# Patient Record
Sex: Male | Born: 1960 | Race: Black or African American | Hispanic: No | Marital: Married | Smoking: Never smoker
Health system: Southern US, Community
[De-identification: ages and names within clinical notes are randomized; demographics above are authoritative.]

## PROBLEM LIST (undated history)

## (undated) DIAGNOSIS — E785 Hyperlipidemia, unspecified: Secondary | ICD-10-CM

## (undated) DIAGNOSIS — E119 Type 2 diabetes mellitus without complications: Secondary | ICD-10-CM

## (undated) DIAGNOSIS — H4010X0 Unspecified open-angle glaucoma, stage unspecified: Secondary | ICD-10-CM

## (undated) DIAGNOSIS — H269 Unspecified cataract: Secondary | ICD-10-CM

## (undated) DIAGNOSIS — I509 Heart failure, unspecified: Secondary | ICD-10-CM

## (undated) DIAGNOSIS — N179 Acute kidney failure, unspecified: Secondary | ICD-10-CM

## (undated) DIAGNOSIS — J189 Pneumonia, unspecified organism: Secondary | ICD-10-CM

## (undated) DIAGNOSIS — M069 Rheumatoid arthritis, unspecified: Secondary | ICD-10-CM

## (undated) DIAGNOSIS — I2510 Atherosclerotic heart disease of native coronary artery without angina pectoris: Secondary | ICD-10-CM

## (undated) DIAGNOSIS — M109 Gout, unspecified: Secondary | ICD-10-CM

## (undated) DIAGNOSIS — I513 Intracardiac thrombosis, not elsewhere classified: Secondary | ICD-10-CM

## (undated) DIAGNOSIS — E559 Vitamin D deficiency, unspecified: Secondary | ICD-10-CM

## (undated) DIAGNOSIS — N289 Disorder of kidney and ureter, unspecified: Secondary | ICD-10-CM

## (undated) DIAGNOSIS — I429 Cardiomyopathy, unspecified: Secondary | ICD-10-CM

## (undated) DIAGNOSIS — I10 Essential (primary) hypertension: Secondary | ICD-10-CM

## (undated) DIAGNOSIS — E213 Hyperparathyroidism, unspecified: Secondary | ICD-10-CM

## (undated) HISTORY — PX: FRACTURE SURGERY: SHX138

---

## 2015-03-31 DIAGNOSIS — E785 Hyperlipidemia, unspecified: Secondary | ICD-10-CM | POA: Insufficient documentation

## 2015-06-27 DIAGNOSIS — M1A09X0 Idiopathic chronic gout, multiple sites, without tophus (tophi): Secondary | ICD-10-CM | POA: Insufficient documentation

## 2015-06-27 DIAGNOSIS — I10 Essential (primary) hypertension: Secondary | ICD-10-CM | POA: Insufficient documentation

## 2015-09-08 DIAGNOSIS — Z7901 Long term (current) use of anticoagulants: Secondary | ICD-10-CM | POA: Insufficient documentation

## 2016-07-10 ENCOUNTER — Inpatient Hospital Stay (HOSPITAL_BASED_OUTPATIENT_CLINIC_OR_DEPARTMENT_OTHER): Payer: Medicare Other

## 2016-07-10 ENCOUNTER — Encounter (HOSPITAL_BASED_OUTPATIENT_CLINIC_OR_DEPARTMENT_OTHER): Payer: Self-pay

## 2016-07-10 ENCOUNTER — Inpatient Hospital Stay (HOSPITAL_BASED_OUTPATIENT_CLINIC_OR_DEPARTMENT_OTHER)
Admission: EM | Admit: 2016-07-10 | Discharge: 2016-07-15 | DRG: 871 | Disposition: A | Discharge status: Home / Self Care | Payer: Medicare Other | Attending: Internal Medicine | Admitting: Internal Medicine

## 2016-07-10 ENCOUNTER — Emergency Department (HOSPITAL_BASED_OUTPATIENT_CLINIC_OR_DEPARTMENT_OTHER): Payer: Medicare Other

## 2016-07-10 DIAGNOSIS — E876 Hypokalemia: Secondary | ICD-10-CM | POA: Diagnosis present

## 2016-07-10 DIAGNOSIS — M069 Rheumatoid arthritis, unspecified: Secondary | ICD-10-CM | POA: Diagnosis present

## 2016-07-10 DIAGNOSIS — R0603 Acute respiratory distress: Secondary | ICD-10-CM

## 2016-07-10 DIAGNOSIS — Z7984 Long term (current) use of oral hypoglycemic drugs: Secondary | ICD-10-CM

## 2016-07-10 DIAGNOSIS — A419 Sepsis, unspecified organism: Secondary | ICD-10-CM | POA: Diagnosis not present

## 2016-07-10 DIAGNOSIS — I130 Hypertensive heart and chronic kidney disease with heart failure and stage 1 through stage 4 chronic kidney disease, or unspecified chronic kidney disease: Secondary | ICD-10-CM | POA: Diagnosis present

## 2016-07-10 DIAGNOSIS — R6521 Severe sepsis with septic shock: Secondary | ICD-10-CM | POA: Diagnosis present

## 2016-07-10 DIAGNOSIS — E872 Acidosis: Secondary | ICD-10-CM | POA: Diagnosis present

## 2016-07-10 DIAGNOSIS — N170 Acute kidney failure with tubular necrosis: Secondary | ICD-10-CM | POA: Diagnosis not present

## 2016-07-10 DIAGNOSIS — Z79899 Other long term (current) drug therapy: Secondary | ICD-10-CM

## 2016-07-10 DIAGNOSIS — Z7901 Long term (current) use of anticoagulants: Secondary | ICD-10-CM | POA: Diagnosis not present

## 2016-07-10 DIAGNOSIS — I428 Other cardiomyopathies: Secondary | ICD-10-CM | POA: Diagnosis present

## 2016-07-10 DIAGNOSIS — N183 Chronic kidney disease, stage 3 unspecified: Secondary | ICD-10-CM | POA: Diagnosis present

## 2016-07-10 DIAGNOSIS — N184 Chronic kidney disease, stage 4 (severe): Secondary | ICD-10-CM | POA: Diagnosis present

## 2016-07-10 DIAGNOSIS — N179 Acute kidney failure, unspecified: Secondary | ICD-10-CM | POA: Diagnosis present

## 2016-07-10 DIAGNOSIS — J9601 Acute respiratory failure with hypoxia: Secondary | ICD-10-CM | POA: Diagnosis present

## 2016-07-10 DIAGNOSIS — J181 Lobar pneumonia, unspecified organism: Secondary | ICD-10-CM

## 2016-07-10 DIAGNOSIS — E878 Other disorders of electrolyte and fluid balance, not elsewhere classified: Secondary | ICD-10-CM | POA: Diagnosis present

## 2016-07-10 DIAGNOSIS — R0600 Dyspnea, unspecified: Secondary | ICD-10-CM | POA: Diagnosis not present

## 2016-07-10 DIAGNOSIS — Z6841 Body Mass Index (BMI) 40.0 and over, adult: Secondary | ICD-10-CM

## 2016-07-10 DIAGNOSIS — D638 Anemia in other chronic diseases classified elsewhere: Secondary | ICD-10-CM | POA: Diagnosis present

## 2016-07-10 DIAGNOSIS — H4010X0 Unspecified open-angle glaucoma, stage unspecified: Secondary | ICD-10-CM | POA: Diagnosis present

## 2016-07-10 DIAGNOSIS — E1165 Type 2 diabetes mellitus with hyperglycemia: Secondary | ICD-10-CM | POA: Diagnosis present

## 2016-07-10 DIAGNOSIS — I5043 Acute on chronic combined systolic (congestive) and diastolic (congestive) heart failure: Secondary | ICD-10-CM | POA: Diagnosis present

## 2016-07-10 DIAGNOSIS — J9621 Acute and chronic respiratory failure with hypoxia: Secondary | ICD-10-CM

## 2016-07-10 DIAGNOSIS — E785 Hyperlipidemia, unspecified: Secondary | ICD-10-CM | POA: Diagnosis present

## 2016-07-10 DIAGNOSIS — Z8249 Family history of ischemic heart disease and other diseases of the circulatory system: Secondary | ICD-10-CM | POA: Diagnosis not present

## 2016-07-10 DIAGNOSIS — I513 Intracardiac thrombosis, not elsewhere classified: Secondary | ICD-10-CM | POA: Diagnosis present

## 2016-07-10 DIAGNOSIS — J159 Unspecified bacterial pneumonia: Secondary | ICD-10-CM | POA: Diagnosis present

## 2016-07-10 DIAGNOSIS — E1122 Type 2 diabetes mellitus with diabetic chronic kidney disease: Secondary | ICD-10-CM | POA: Diagnosis present

## 2016-07-10 DIAGNOSIS — J189 Pneumonia, unspecified organism: Secondary | ICD-10-CM | POA: Diagnosis present

## 2016-07-10 HISTORY — DX: Atherosclerotic heart disease of native coronary artery without angina pectoris: I25.10

## 2016-07-10 HISTORY — DX: Hyperparathyroidism, unspecified: E21.3

## 2016-07-10 HISTORY — DX: Rheumatoid arthritis, unspecified: M06.9

## 2016-07-10 HISTORY — DX: Hyperlipidemia, unspecified: E78.5

## 2016-07-10 HISTORY — DX: Intracardiac thrombosis, not elsewhere classified: I51.3

## 2016-07-10 HISTORY — DX: Acute kidney failure, unspecified: N17.9

## 2016-07-10 HISTORY — DX: Gout, unspecified: M10.9

## 2016-07-10 HISTORY — DX: Pneumonia, unspecified organism: J18.9

## 2016-07-10 HISTORY — DX: Unspecified cataract: H26.9

## 2016-07-10 HISTORY — DX: Heart failure, unspecified: I50.9

## 2016-07-10 HISTORY — DX: Unspecified open-angle glaucoma, stage unspecified: H40.10X0

## 2016-07-10 HISTORY — DX: Cardiomyopathy, unspecified: I42.9

## 2016-07-10 HISTORY — DX: Essential (primary) hypertension: I10

## 2016-07-10 HISTORY — DX: Vitamin D deficiency, unspecified: E55.9

## 2016-07-10 HISTORY — DX: Disorder of kidney and ureter, unspecified: N28.9

## 2016-07-10 HISTORY — DX: Type 2 diabetes mellitus without complications: E11.9

## 2016-07-10 LAB — COMPREHENSIVE METABOLIC PANEL
ALBUMIN: 2.9 g/dL — AB (ref 3.5–5.0)
ALK PHOS: 58 U/L (ref 38–126)
ALT: 22 U/L (ref 17–63)
AST: 21 U/L (ref 15–41)
Anion gap: 12 (ref 5–15)
BILIRUBIN TOTAL: 1 mg/dL (ref 0.3–1.2)
BUN: 41 mg/dL — ABNORMAL HIGH (ref 6–20)
CO2: 18 mmol/L — ABNORMAL LOW (ref 22–32)
Calcium: 7.5 mg/dL — ABNORMAL LOW (ref 8.9–10.3)
Chloride: 109 mmol/L (ref 101–111)
Creatinine, Ser: 4.1 mg/dL — ABNORMAL HIGH (ref 0.61–1.24)
GFR calc Af Amer: 17 mL/min — ABNORMAL LOW (ref >60)
GFR calc non Af Amer: 15 mL/min — ABNORMAL LOW (ref >60)
GLUCOSE: 183 mg/dL — AB (ref 65–99)
Potassium: 3.2 mmol/L — ABNORMAL LOW (ref 3.5–5.1)
Sodium: 139 mmol/L (ref 135–145)
Total Protein: 6.5 g/dL (ref 6.5–8.1)

## 2016-07-10 LAB — CBC WITH DIFFERENTIAL/PLATELET
BASOS ABS: 0 10*3/uL (ref 0.0–0.1)
Basophils Relative: 0 %
Eosinophils Absolute: 0 10*3/uL (ref 0.0–0.7)
Eosinophils Relative: 0 %
HCT: 37.4 % — ABNORMAL LOW (ref 39.0–52.0)
HEMOGLOBIN: 12.6 g/dL — AB (ref 13.0–17.0)
LYMPHS ABS: 1.5 10*3/uL (ref 0.7–4.0)
Lymphocytes Relative: 7 %
MCH: 25 pg — AB (ref 26.0–34.0)
MCHC: 33.7 g/dL (ref 30.0–36.0)
MCV: 74.2 fL — AB (ref 78.0–100.0)
MONO ABS: 2.3 10*3/uL — AB (ref 0.1–1.0)
MONOS PCT: 11 %
NEUTROS ABS: 17.1 10*3/uL — AB (ref 1.7–7.7)
Neutrophils Relative %: 82 %
PLATELETS: 236 10*3/uL (ref 150–400)
RBC: 5.04 MIL/uL (ref 4.22–5.81)
RDW: 16.6 % — ABNORMAL HIGH (ref 11.5–15.5)
WBC Morphology: INCREASED
WBC: 20.9 10*3/uL — AB (ref 4.0–10.5)

## 2016-07-10 LAB — I-STAT ARTERIAL BLOOD GAS, ED
Acid-base deficit: 6 mmol/L — ABNORMAL HIGH (ref 0.0–2.0)
Acid-base deficit: 8 mmol/L — ABNORMAL HIGH (ref 0.0–2.0)
BICARBONATE: 18.7 mmol/L — AB (ref 20.0–28.0)
BICARBONATE: 15.8 mmol/L — AB (ref 20.0–28.0)
O2 SAT: 93 %
O2 Saturation: 100 %
PO2 ART: 197 mmHg — AB (ref 83.0–108.0)
Patient temperature: 98.3
TCO2: 20 mmol/L (ref 0–100)
TCO2: 17 mmol/L (ref 0–100)
pCO2 arterial: 32.1 mmHg (ref 32.0–48.0)
pCO2 arterial: 29.1 mmHg — ABNORMAL LOW (ref 32.0–48.0)
pH, Arterial: 7.372 (ref 7.350–7.450)
pH, Arterial: 7.346 — ABNORMAL LOW (ref 7.350–7.450)
pO2, Arterial: 72 mmHg — ABNORMAL LOW (ref 83.0–108.0)

## 2016-07-10 LAB — RESPIRATORY PANEL BY PCR
ADENOVIRUS-RVPPCR: NOT DETECTED
Bordetella pertussis: NOT DETECTED
CHLAMYDOPHILA PNEUMONIAE-RVPPCR: NOT DETECTED
CORONAVIRUS NL63-RVPPCR: NOT DETECTED
Coronavirus 229E: NOT DETECTED
Coronavirus HKU1: NOT DETECTED
Coronavirus OC43: NOT DETECTED
INFLUENZA A-RVPPCR: NOT DETECTED
Influenza B: NOT DETECTED
MYCOPLASMA PNEUMONIAE-RVPPCR: NOT DETECTED
Metapneumovirus: NOT DETECTED
PARAINFLUENZA VIRUS 4-RVPPCR: NOT DETECTED
Parainfluenza Virus 1: NOT DETECTED
Parainfluenza Virus 2: NOT DETECTED
Parainfluenza Virus 3: NOT DETECTED
Respiratory Syncytial Virus: NOT DETECTED
Rhinovirus / Enterovirus: NOT DETECTED

## 2016-07-10 LAB — I-STAT CG4 LACTIC ACID, ED
Lactic Acid, Venous: 1.55 mmol/L (ref 0.5–1.9)
Lactic Acid, Venous: 1.26 mmol/L (ref 0.5–1.9)

## 2016-07-10 LAB — BASIC METABOLIC PANEL
Anion gap: 9 (ref 5–15)
BUN: 37 mg/dL — AB (ref 6–20)
CHLORIDE: 114 mmol/L — AB (ref 101–111)
CO2: 19 mmol/L — ABNORMAL LOW (ref 22–32)
CREATININE: 3.48 mg/dL — AB (ref 0.61–1.24)
Calcium: 8.2 mg/dL — ABNORMAL LOW (ref 8.9–10.3)
GFR calc Af Amer: 21 mL/min — ABNORMAL LOW (ref >60)
GFR calc non Af Amer: 18 mL/min — ABNORMAL LOW (ref >60)
GLUCOSE: 142 mg/dL — AB (ref 65–99)
POTASSIUM: 2.8 mmol/L — AB (ref 3.5–5.1)
SODIUM: 142 mmol/L (ref 135–145)

## 2016-07-10 LAB — LACTIC ACID, PLASMA
LACTIC ACID, VENOUS: 2.6 mmol/L — AB (ref 0.5–1.9)
Lactic Acid, Venous: 2.3 mmol/L (ref 0.5–1.9)

## 2016-07-10 LAB — TROPONIN I
TROPONIN I: 0.03 ng/mL — AB (ref <0.03)
Troponin I: 0.03 ng/mL (ref <0.03)

## 2016-07-10 LAB — PROTIME-INR
INR: 2.66
Prothrombin Time: 28.9 seconds — ABNORMAL HIGH (ref 11.4–15.2)

## 2016-07-10 LAB — MRSA PCR SCREENING: MRSA BY PCR: NEGATIVE

## 2016-07-10 LAB — GLUCOSE, CAPILLARY
GLUCOSE-CAPILLARY: 188 mg/dL — AB (ref 65–99)
Glucose-Capillary: 205 mg/dL — ABNORMAL HIGH (ref 65–99)
Glucose-Capillary: 213 mg/dL — ABNORMAL HIGH (ref 65–99)

## 2016-07-10 LAB — C DIFFICILE QUICK SCREEN W PCR REFLEX
C DIFFICILE (CDIFF) INTERP: NOT DETECTED
C DIFFICILE (CDIFF) TOXIN: NEGATIVE
C Diff antigen: NEGATIVE

## 2016-07-10 LAB — MAGNESIUM: MAGNESIUM: 1 mg/dL — AB (ref 1.7–2.4)

## 2016-07-10 LAB — BRAIN NATRIURETIC PEPTIDE: B Natriuretic Peptide: 123.3 pg/mL — ABNORMAL HIGH (ref 0.0–100.0)

## 2016-07-10 LAB — PROCALCITONIN

## 2016-07-10 LAB — DIGOXIN LEVEL: Digoxin Level: <0.2 ng/mL — ABNORMAL LOW (ref 0.8–2.0)

## 2016-07-10 MED ORDER — DEXTROSE 5 % IV SOLN
500.0000 mg | INTRAVENOUS | Status: DC
  Administered 2016-07-10 – 2016-07-15 (×6): 500 mg via INTRAVENOUS
  Filled 2016-07-10 (×5): qty 500

## 2016-07-10 MED ORDER — LEVALBUTEROL HCL 0.63 MG/3ML IN NEBU
0.6300 mg | INHALATION_SOLUTION | Freq: Once | RESPIRATORY_TRACT | Status: AC
  Administered 2016-07-10: 0.63 mg via RESPIRATORY_TRACT
  Filled 2016-07-10: qty 3

## 2016-07-10 MED ORDER — CEFTRIAXONE SODIUM 1 G IJ SOLR: 1.0000 g | Freq: Once | INTRAMUSCULAR | Status: DC

## 2016-07-10 MED ORDER — POTASSIUM CHLORIDE CRYS ER 20 MEQ PO TBCR
40.0000 meq | EXTENDED_RELEASE_TABLET | Freq: Two times a day (BID) | ORAL | Status: AC
  Administered 2016-07-10 – 2016-07-11 (×2): 40 meq via ORAL
  Filled 2016-07-10 (×2): qty 2

## 2016-07-10 MED ORDER — CARVEDILOL 25 MG PO TABS
25.0000 mg | ORAL_TABLET | Freq: Two times a day (BID) | ORAL | Status: DC
  Administered 2016-07-10 – 2016-07-15 (×9): 25 mg via ORAL
  Filled 2016-07-10 (×5): qty 1
  Filled 2016-07-10 (×2): qty 2
  Filled 2016-07-10: qty 1
  Filled 2016-07-10: qty 2

## 2016-07-10 MED ORDER — ACETAMINOPHEN 325 MG PO TABS
650.0000 mg | ORAL_TABLET | Freq: Once | ORAL | Status: AC
  Administered 2016-07-10: 650 mg via ORAL
  Filled 2016-07-10: qty 2

## 2016-07-10 MED ORDER — SODIUM CHLORIDE 0.9 % IV BOLUS (SEPSIS): 1000.0000 mL | Freq: Once | INTRAVENOUS | Status: DC

## 2016-07-10 MED ORDER — LABETALOL HCL 5 MG/ML IV SOLN
10.0000 mg | INTRAVENOUS | Status: DC | PRN
  Administered 2016-07-10 – 2016-07-14 (×4): 10 mg via INTRAVENOUS
  Filled 2016-07-10 (×5): qty 4

## 2016-07-10 MED ORDER — PIPERACILLIN-TAZOBACTAM IN DEX 2-0.25 GM/50ML IV SOLN
2.2500 g | Freq: Once | INTRAVENOUS | Status: DC
Start: 2016-07-10 — End: 2016-07-10
  Filled 2016-07-10: qty 50

## 2016-07-10 MED ORDER — POTASSIUM CHLORIDE CRYS ER 20 MEQ PO TBCR
40.0000 meq | EXTENDED_RELEASE_TABLET | Freq: Two times a day (BID) | ORAL | Status: DC
  Administered 2016-07-10: 40 meq via ORAL
  Filled 2016-07-10: qty 2

## 2016-07-10 MED ORDER — AZITHROMYCIN 500 MG IV SOLR
INTRAVENOUS | Status: AC
  Filled 2016-07-10: qty 500

## 2016-07-10 MED ORDER — INSULIN ASPART 100 UNIT/ML ~~LOC~~ SOLN
0.0000 [IU] | SUBCUTANEOUS | Status: DC
  Administered 2016-07-10 (×2): 5 [IU] via SUBCUTANEOUS
  Administered 2016-07-10: 3 [IU] via SUBCUTANEOUS
  Administered 2016-07-11: 2 [IU] via SUBCUTANEOUS

## 2016-07-10 MED ORDER — SODIUM CHLORIDE 0.9 % IV BOLUS (SEPSIS)
1000.0000 mL | Freq: Once | INTRAVENOUS | Status: AC
  Administered 2016-07-10: 1000 mL via INTRAVENOUS

## 2016-07-10 MED ORDER — LACTATED RINGERS IV SOLN
INTRAVENOUS | Status: DC
  Administered 2016-07-10 – 2016-07-12 (×4): via INTRAVENOUS

## 2016-07-10 MED ORDER — WARFARIN SODIUM 2.5 MG PO TABS
7.5000 mg | ORAL_TABLET | Freq: Once | ORAL | Status: AC
  Administered 2016-07-10: 7.5 mg via ORAL
  Filled 2016-07-10: qty 1

## 2016-07-10 MED ORDER — WARFARIN - PHARMACIST DOSING INPATIENT: Freq: Every day | Status: DC

## 2016-07-10 MED ORDER — DEXTROSE 5 % IV SOLN
1.0000 g | INTRAVENOUS | Status: DC
  Administered 2016-07-10 – 2016-07-15 (×6): 1 g via INTRAVENOUS
  Filled 2016-07-10 (×6): qty 10

## 2016-07-10 MED ORDER — POTASSIUM CHLORIDE 10 MEQ/100ML IV SOLN: 10.0000 meq | Freq: Once | INTRAVENOUS | Status: DC

## 2016-07-10 MED ORDER — ACETAMINOPHEN 325 MG PO TABS
650.0000 mg | ORAL_TABLET | ORAL | Status: DC | PRN
  Administered 2016-07-10: 650 mg via ORAL
  Filled 2016-07-10: qty 2

## 2016-07-10 MED ORDER — LACTATED RINGERS IV BOLUS (SEPSIS)
3000.0000 mL | Freq: Once | INTRAVENOUS | Status: AC
  Administered 2016-07-10: 3000 mL via INTRAVENOUS

## 2016-07-10 MED ORDER — PANTOPRAZOLE SODIUM 40 MG IV SOLR
40.0000 mg | Freq: Every day | INTRAVENOUS | Status: DC
  Administered 2016-07-10: 40 mg via INTRAVENOUS
  Filled 2016-07-10: qty 40

## 2016-07-10 MED ORDER — AMLODIPINE BESYLATE 10 MG PO TABS
10.0000 mg | ORAL_TABLET | Freq: Every day | ORAL | Status: DC
  Administered 2016-07-10 – 2016-07-15 (×6): 10 mg via ORAL
  Filled 2016-07-10 (×6): qty 1

## 2016-07-10 MED ORDER — SODIUM CHLORIDE 0.9 % IV SOLN: 250.0000 mL | INTRAVENOUS | Status: DC | PRN

## 2016-07-10 MED ORDER — CHLORHEXIDINE GLUCONATE 0.12 % MT SOLN
15.0000 mL | Freq: Two times a day (BID) | OROMUCOSAL | Status: DC
  Administered 2016-07-10 – 2016-07-11 (×3): 15 mL via OROMUCOSAL
  Filled 2016-07-10 (×3): qty 15

## 2016-07-10 MED ORDER — DEXTROSE 5 % IV SOLN: 500.0000 mg | Freq: Once | INTRAVENOUS | Status: DC

## 2016-07-10 MED ORDER — ORAL CARE MOUTH RINSE
15.0000 mL | Freq: Two times a day (BID) | OROMUCOSAL | Status: DC
  Administered 2016-07-10: 15 mL via OROMUCOSAL

## 2016-07-10 NOTE — Progress Notes (Signed)
CRITICAL VALUE ALERT  Critical value received:  Lactic - 2.3, troponin - 0.03  Date of notification:  07/10/16  Time of notification:  13:20  Critical value read back:Yes.    Nurse who received alert:  Juel Burrow, RN  MD notified (1st page):  Marni Griffon, ACNP  Time of first page:  13:20  MD notified (2nd page):  Time of second page:  Responding MD:  Marni Griffon, ACNP  Time MD responded: 13:20

## 2016-07-10 NOTE — ED Notes (Signed)
IV attempt x3 with no success, EDP notified and aware; only 1 BC collected

## 2016-07-10 NOTE — Progress Notes (Signed)
Pt requesting break from BIPAP.  Pt placed on 10 LPM [place].  Pt tolerating well at this time, RT to monitor and assess as needed.

## 2016-07-10 NOTE — Progress Notes (Signed)
[facility] Pt received on BIPAP. Rt tried to place pt on 10 LPMHF sat 94%. Pt complain on SOB. Per NP ok to place back on  BIPAP.

## 2016-07-10 NOTE — ED Provider Notes (Signed)
[facility] Provider Note: Georgena Spurling, MD, FACEP  CSN: [id number] MRN: [id number] ARRIVAL: 07/10/16 at [place]: [place]  Joshua Fernandez is a 56 y.o. male with congestive heart failure, diabetes and stage III renal insufficiency. He is here with a two-day history of shortness of breath. This began with cough and cold symptoms yesterday which have gradually worsened. He has had paroxysms of cough that have caused him to gag. His breathing became acutely worse this morning and he was in acute distress on arrival with hypoxia. He has had a subjective fever but no chest pain. He has not been vomiting or had diarrhea. He has had general malaise.   Past Medical History:  Diagnosis Date  . AKI (acute kidney injury) ([place])   . Cardiomyopathy ([place])   . Cataract   . CHF (congestive heart failure) ([place])   . Coronary artery disease   . Diabetes mellitus without complication ([place])   . Gout   . Hyperlipemia   . Hyperparathyroidism ([place])   . Hypertension   . Open-angle glaucoma   . Pneumonia   . Renal disorder    chronic kidney disease stage 3  . Rheumatoid arthritis ([place])   . 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 Vitamin D deficiency     Past Surgical History:  Procedure Laterality Date  . FRACTURE SURGERY      No family history on file.  Social History  Substance Use Topics  . Smoking status: Never Smoker  . Smokeless tobacco: Never Used  . Alcohol use No    Prior to Admission medications   Medication Sig Start Date End Date Taking? Authorizing Provider  amLODipine (NORVASC) 10 MG tablet Take 10 mg by mouth daily.   Yes Historical Provider, MD  atorvastatin (LIPITOR) 40 MG tablet Take 40 mg by mouth daily.   Yes Historical Provider, MD  carvedilol (COREG) 25 MG tablet Take 25 mg by mouth 2 (two) times daily with a meal.   Yes Historical Provider, MD    cholecalciferol (VITAMIN D) 400 units TABS tablet Take 4,000 Units by mouth.   Yes Historical Provider, MD  digoxin (LANOXIN) 0.25 MG tablet Take 125 mg by mouth daily.   Yes Historical Provider, MD  glipiZIDE (GLUCOTROL XL) 10 MG 24 hr tablet Take 10 mg by mouth daily with breakfast.   Yes Historical Provider, MD  latanoprost (XALATAN) 0.005 % ophthalmic solution Place 1 drop into both eyes at bedtime.   Yes Historical Provider, MD  lisinopril (PRINIVIL,ZESTRIL) 40 MG tablet Take 40 mg by mouth daily.   Yes Historical Provider, MD  potassium chloride SA (K-DUR,KLOR-CON) 20 MEQ tablet Take 20 mEq by mouth 2 (two) times daily.   Yes Historical Provider, MD  warfarin (COUMADIN) 10 MG tablet Take 10 mg by mouth daily.   Yes Historical Provider, MD    Allergies Patient has no known allergies.   REVIEW OF SYSTEMS  Negative except as noted here or in the History of Present Illness.   PHYSICAL EXAMINATION  Initial Vital Signs Blood pressure (!) 108/53, pulse (!) 104, temperature 98.3 F (36.8 C), temperature source Oral, resp. rate (!) 37, SpO2 99 %.  Examination General: Well-developed, well-nourished male in moderate distress; appearance consistent with age of record HENT: normocephalic; atraumatic Eyes: pupils equal, round and reactive to light; extraocular muscles intact Neck: supple Heart: regular rate and rhythm; tachycardia  Lungs: Tachypnea; rales right base Abdomen: soft; nondistended; nontender; bowel sounds present Extremities: No deformity; full range of motion; pulses normal Neurologic: Awake, alert; motor function intact in all extremities and symmetric; no facial droop Skin: Warm and dry Psychiatric: Anxious   RESULTS  Summary of this visit's results, reviewed by myself:   EKG Interpretation  Date/Time:  Wednesday July 10 2016 03:30:16 EDT Ventricular Rate:  106 PR Interval:    QRS Duration: 102 QT Interval:  327 QTC Calculation: 435 R Axis:   91 Text  Interpretation:  Sinus tachycardia Prolonged PR interval Left posterior fascicular block Nonspecific repol abnormality, lateral leads Baseline wander in lead(s) III No previous ECGs available Confirmed by Shawnmichael Parenteau  MD, Jenny Reichmann ([id number]) on 07/10/2016 4:00:26 AM      Laboratory Studies: Results for orders placed or performed during the hospital encounter of 07/10/16 (from the past 24 hour(s))  Brain natriuretic peptide     Status: Abnormal   Collection Time: 07/10/16  3:30 AM  Result Value Ref Range   B Natriuretic Peptide 123.3 (H) 0.0 - 100.0 pg/mL  Troponin I     Status: None   Collection Time: 07/10/16  3:30 AM  Result Value Ref Range   Troponin I <0.03 <0.03 ng/mL  CBC with Differential/Platelet     Status: Abnormal   Collection Time: 07/10/16  3:30 AM  Result Value Ref Range   WBC 20.9 (H) 4.0 - 10.5 K/uL   RBC 5.04 4.22 - 5.81 MIL/uL   Hemoglobin 12.6 (L) 13.0 - 17.0 g/dL   HCT 37.4 (L) 39.0 - 52.0 %   MCV 74.2 (L) 78.0 - 100.0 fL   MCH 25.0 (L) 26.0 - 34.0 pg   MCHC 33.7 30.0 - 36.0 g/dL   RDW 16.6 (H) 11.5 - 15.5 %   Platelets 236 150 - 400 K/uL   Neutrophils Relative % 82 %   Lymphocytes Relative 7 %   Monocytes Relative 11 %   Eosinophils Relative 0 %   Basophils Relative 0 %   Neutro Abs 17.1 (H) 1.7 - 7.7 K/uL   Lymphs Abs 1.5 0.7 - 4.0 K/uL   Monocytes Absolute 2.3 (H) 0.1 - 1.0 K/uL   Eosinophils Absolute 0.0 0.0 - 0.7 K/uL   Basophils Absolute 0.0 0.0 - 0.1 K/uL   RBC Morphology ELLIPTOCYTES    WBC Morphology INCREASED BANDS (>20% BANDS)    Smear Review PLATELET COUNT CONFIRMED BY SMEAR   Basic metabolic panel     Status: Abnormal   Collection Time: 07/10/16  3:30 AM  Result Value Ref Range   Sodium 142 135 - 145 mmol/L   Potassium 2.8 (L) 3.5 - 5.1 mmol/L   Chloride 114 (H) 101 - 111 mmol/L   CO2 19 (L) 22 - 32 mmol/L   Glucose, Bld 142 (H) 65 - 99 mg/dL   BUN 37 (H) 6 - 20 mg/dL   Creatinine, Ser 3.48 (H) 0.61 - 1.24 mg/dL   Calcium 8.2 (L) 8.9 - 10.3 mg/dL     GFR calc non Af Amer 18 (L) >60 mL/min   GFR calc Af Amer 21 (L) >60 mL/min   Anion gap 9 5 - 15  Protime-INR     Status: Abnormal   Collection Time: 07/10/16  3:30 AM  Result Value Ref Range   Prothrombin Time 28.9 (H) 11.4 - 15.2 seconds   INR 2.66   Magnesium     Status: Abnormal   Collection Time: 07/10/16  3:30 AM  Result  Value Ref Range   Magnesium 1.0 (L) 1.7 - 2.4 mg/dL  I-Stat Arterial Blood Gas, ED - (order at [facility] and [facility] only)     Status: Abnormal   Collection Time: 07/10/16  4:12 AM  Result Value Ref Range   pH, Arterial 7.372 7.350 - 7.450   pCO2 arterial 32.1 32.0 - 48.0 mmHg   pO2, Arterial 197.0 (H) 83.0 - 108.0 mmHg   Bicarbonate 18.7 (L) 20.0 - 28.0 mmol/L   TCO2 20 0 - 100 mmol/L   O2 Saturation 100.0 %   Acid-base deficit 6.0 (H) 0.0 - 2.0 mmol/L   Patient temperature 98.3 F    Collection site RADIAL, ALLEN'S TEST ACCEPTABLE    Drawn by RT    Sample type ARTERIAL   I-Stat CG4 Lactic Acid, ED     Status: None   Collection Time: 07/10/16  5:03 AM  Result Value Ref Range   Lactic Acid, Venous 1.55 0.5 - 1.9 mmol/L   Imaging Studies: Dg Chest Port 1 View  Result Date: 07/10/2016 CLINICAL DATA:  Acute onset of severe shortness of breath. Initial encounter. EXAM: PORTABLE CHEST 1 VIEW COMPARISON:  None. FINDINGS: The lungs are hypoexpanded. Vascular congestion is noted. Right basilar airspace opacity raises concern for pneumonia. There is no evidence of pleural effusion or pneumothorax. The cardiomediastinal silhouette is enlarged. No acute osseous abnormalities are seen. IMPRESSION: 1. Lungs hypoexpanded. Right basilar airspace opacity raises concern for pneumonia. 2. Vascular congestion and cardiomegaly. Electronically Signed   By: Garald Balding M.D.   On: 07/10/2016 04:02    ED COURSE  Nursing notes and initial vitals signs, including pulse oximetry, reviewed.  Vitals:   07/10/16 0351 07/10/16 0415 07/10/16 0446 07/10/16 0555  BP:    (!) 126/95  Pulse:  (!) 104 97  89  Resp: (!) 37 18  (!) 36  Temp:    (!) 101.6 F (38.7 C)  TempSrc:    Rectal  SpO2: 99% 97% 97% 97%   5:01 AM Patient placed on BiPAP immediately after arrival. Once diagnostic studies were obtained became apparent that his symptoms were likely due to community-acquired pneumonia as opposed to acute exacerbation of congestive heart failure.  Lactic acid is within normal limits. Blood cultures drawn.  5:16 AM Dr. Loleta Books except for transfer to [facility] stepdown. We will start Rocephin and Zithromax for treatment of his community acquired pneumonia.  5:50 AM Temperature 101.6 degrees rectally. Tylenol ordered.  6:50 AM Stable; sleeping peacefully on BiPAP.  PROCEDURES   CRITICAL CARE Performed by: Shanon Rosser L Total critical care time: 45 minutes Critical care time was exclusive of separately billable procedures and treating other patients. Critical care was necessary to treat or prevent imminent or life-threatening deterioration. Critical care was time spent personally by me on the following activities: development of treatment plan with patient and/or surrogate as well as nursing, discussions with consultants, evaluation of patient's response to treatment, examination of patient, obtaining history from patient or surrogate, ordering and performing treatments and interventions, ordering and review of laboratory studies, ordering and review of radiographic studies, pulse oximetry and re-evaluation of patient's condition.   ED DIAGNOSES     ICD-9-CM ICD-10-CM   1. Community acquired pneumonia of right lower lobe of lung ([place]) 481 J18.1   2. Respiratory distress 786.09 R06.00   3. Chronic renal disease, stage III 585.3 N18.3   4. Hypokalemia 276.8 E87.6        Shanon Rosser, MD 07/10/16 [phone]

## 2016-07-10 NOTE — ED Provider Notes (Addendum)
Patient the proximally and 1 hour ago dropped his blood pressures were getting pressures around 80 systolic but mostly upper 62O systolic. Patient received 700 mL of fluid sepsis formally initiated. Patient RE received antibiotics. After 7 mL of fluid blood pressure came up to the 469 systolic. Patient also had repeat chest x-ray without any significant change other than the known to me acquired pneumonia. Arterial blood gas without significant change scars a respiratory standpoint. Slightly worsening of pH and metabolic acidosis status. No respiratory worsening. Patient still alert and oriented on the BiPAP.  Patient was originally arranged for [facility]down admission [facility] about 4-1/2 hours ago. Discussed with step down bed that was available at [facility] long the hospitalist was not comfortable. Called critical care. Critical care willing to put him in the ICU overnight until he is better stabilized. They agree with treatment plan so far. Patient's had to lactic acid set of been less than 2.  Patient has a known history of renal insufficiency not on dialysis. Chest x-ray showed no evidence of congestive heart failure pulmonary edema. Patient was also does have a history of CHF. Patient's blood pressure improvement also could be because his blood pressure medicines and not been administered today. That may be helping out. Patient was to receive 4 L of fluid based on sepsis protocol but since his blood pressures have improved it was stopped at a total of 700 mL.  Pulmonary critical care has accepted into the ICU at [place]. [facility] will transport.   CRITICAL CARE Performed by: Fredia Sorrow Total critical care time: 30 minutes Critical care time was exclusive of separately billable procedures and treating other patients. Critical care was necessary to treat or prevent imminent or life-threatening deterioration. Critical care was time spent personally by me on the following activities: development of  treatment plan with patient and/or surrogate as well as nursing, discussions with consultants, evaluation of patient's response to treatment, examination of patient, obtaining history from patient or surrogate, ordering and performing treatments and interventions, ordering and review of laboratory studies, ordering and review of radiographic studies, pulse oximetry and re-evaluation of patient's condition.    Fredia Sorrow, MD 07/10/16 [phone]    No ICU beds at [facility] patient [facility] long admitting critical care physician unchanged. It will be Dr. Samella Parr, MD 07/10/16 1005

## 2016-07-10 NOTE — Progress Notes (Signed)
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  Initially he had 2 days dyspnea, htought he was having CHF flare.  In ER, was in distress, put on BiPAP.   Blood pressure (!) 108/53, pulse 97, temperature 98.3 F (36.8 C), temperature source Oral, resp. rate 18, SpO2 97 %. Na 142, K 2.8, Cr 3.48 (baseline 2.7-3.1), WBC 20.9, Hgb 12 Lactic acid 1.5 BNP 120 Troponin negative CXR showed possibly right basilar opacity ABG showed 7.37, pCO2 32, pO2 190  Blood cultures obtained, ceftriaxone and azithromycin given To stepdown on BiPAP.

## 2016-07-10 NOTE — ED Notes (Signed)
Pt wife Theophilus Kinds [phone]

## 2016-07-10 NOTE — ED Notes (Signed)
ED Provider at bedside. 

## 2016-07-10 NOTE — Progress Notes (Signed)
[place] Progress Note Patient Name: Lonald Troiani DOB: May 31, 1960 MRN: [id number]   Date of Service  07/10/2016  HPI/Events of Note  htn  IV labe  eICU Interventions       Intervention Category Intermediate Interventions: Hypertension - evaluation and management  Raylene Miyamoto. 07/10/2016, 10:48 PM

## 2016-07-10 NOTE — H&P (Signed)
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  Presents to ER at [facility] w/ 2d h/o what was initially described as URI symptoms, subjective fever, general malaise, head congestion, sore throat & cough. Symptoms worsened, coughing worse. On arrival to ER was temp 101.6, noted to be hypoxic w/ increased work of breathing which ultimately required NIPPV. CXR showed RLL infiltrate. Was to be admitted to SDU. While awaiting bed SBP dropped to 80s. Received 700 ml bolus and SBP improved to 136. Because of this 51ml/kg bolus was held off. He did receive abx in the ER. Because of his hypotension it was decided that he should be admitted to the intensive care for close observation.   PAST MEDICAL HISTORY :  He  has a past medical history of AKI (acute kidney injury) ([place]); Cardiomyopathy ([place]); Cataract; CHF (congestive heart failure) ([place]); Coronary artery disease; Diabetes mellitus without complication ([place]); Gout; Hyperlipemia; Hyperparathyroidism ([place]); Hypertension; Open-angle glaucoma; Pneumonia; Renal disorder; Rheumatoid arthritis ([place]); Thrombus in heart chamber; and Vitamin D deficiency.  PAST SURGICAL HISTORY: He  has a past surgical history that includes Fracture surgery.  No Known Allergies  Current medication list from [facility]  . amLODIPine (NORVASC) 10 MG tablet Take 1 tablet (10 mg total) by mouth daily.  Marland Kitchen atorvastatin (LIPITOR) 40 MG tablet TAKE ONE TABLET BY MOUTH ONCE DAILY . carvedilol (COREG) 25 MG tablet Take 25 mg by mouth 2  times daily with meals.  . cholecalciferol, vitamin D3, (VITAMIN D3) 2,000 unit Cap Take 4,000 Units by mouth daily.  . digoxin (LANOXIN) 0.25 MG tablet Take 125 mcg by mouth daily.  Marland Kitchen glipiZIDE XL (GLUCOTROL XL) 10 MG 24 hr tablet TAKE ONE TABLET BY MOUTH ONCE DAILY  . KLOR-CON M20 20 mEq extended release tablet TAKE ONE TABLET BY MOUTH ONCE DAILY 90 tablet 0  . latanoprost (XALATAN) 0.005 % ophthalmic solution 1 drop both eyes every night 5 mL 11  . lisinopril (PRINIVIL,ZESTRIL) 40 MG tablet TAKE ONE TABLET BY MOUTH ONCE DAILY 90 tablet 1  . warfarin (COUMADIN) 10 MG tablet *ANTICOAGULANT* Take 1 tablet daily except 1/2 tablet on Saturday or as directed by Coumadin Clinic (Patient taking differently: Take 1 tablet daily or as directed by Coumadin Clinic)   FAMILY HISTORY:  From [facility] records.  . Cataracts Mother  . Diabetes Mother  . Heart failure Maternal Uncle  . Heart failure Paternal Uncle   SOCIAL HISTORY: He  reports that he has never smoked. He has never used smokeless tobacco. He reports that he does not drink alcohol.  REVIEW OF SYSTEMS:    SUBJECTIVE:  Feels a little better. Likes BIPAP better than high-flow   VITAL SIGNS: BP 127/73   Pulse 84   Temp 99.7 F (37.6 C) (Axillary)   Resp (!) 33   Wt 293 lb (132.9 kg)   SpO2 94%   HEMODYNAMICS:    VENTILATOR SETTINGS: FiO2 (%):  [50 %] 50 %  INTAKE / OUTPUT: No intake/output data recorded.  General appearance:  56 Year old  Male, well nourished  conversant but does have RR in 30s and accessory use  Eyes: anicteric sclerae, moist conjunctivae; PERRL, EOMI bilaterally. Mouth:  membranes and no mucosal ulcerations; normal hard and soft palate Neck: Trachea midline; neck supple, no JVD Lungs/chest: rales R>L, with some increased respiratory effort  CV: RRR, no MRGs  Abdomen: Soft, non-tender; no masses or HSM Extremities: trace peripheral edema or extremity lymphadenopathy Skin: Normal temperature, turgor and  texture; no rash, ulcers or subcutaneous nodules Neuro/Psych: Appropriate affect, alert and oriented to person, place and time   LABS:  BMET  Recent Labs Lab 07/10/16 0330 07/10/16 0900  NA 142 139  K 2.8* 3.2*  CL 114* 109  CO2 19* 18*  BUN 37* 41*  CREATININE 3.48* 4.10*  GLUCOSE 142* 183*    Electrolytes  Recent Labs Lab 07/10/16 0330 07/10/16 0900  CALCIUM 8.2* 7.5*  MG 1.0*  --     CBC  Recent Labs Lab 07/10/16 0330  WBC 20.9*  HGB 12.6*  HCT 37.4*  PLT 236    Coag's  Recent Labs Lab 07/10/16 0330  INR 2.66    Sepsis Markers  Recent Labs Lab 07/10/16 0503 07/10/16 0829  LATICACIDVEN 1.55 1.26    ABG  Recent Labs Lab 07/10/16 0412 07/10/16 0828  PHART 7.372 7.346*  PCO2ART 32.1 29.1*  PO2ART 197.0* 72.0*    Liver Enzymes  Recent Labs Lab 07/10/16 0900  AST 21  ALT 22  ALKPHOS 58  BILITOT 1.0  ALBUMIN 2.9*    Cardiac Enzymes  Recent Labs Lab 07/10/16 0330  TROPONINI <0.03    Glucose No results for input(s): GLUCAP in the last 168 hours.  Imaging Dg Chest Port 1 View  Result Date: 07/10/2016 CLINICAL DATA:  Shortness of Breath on BiPAP EXAM: PORTABLE CHEST 1 VIEW COMPARISON:  07/10/2016 FINDINGS: Cardiomegaly again noted. Persistent airspace opacity in right infrahilar / base medially suspicious for pneumonia. No pulmonary edema. IMPRESSION: Cardiomegaly again noted. Persistent airspace opacity in right infrahilar / base medially suspicious for pneumonia. Electronically Signed   By: Lahoma Crocker M.D.   On: 07/10/2016 08:40   Dg Chest Port 1 View  Result Date: 07/10/2016 CLINICAL DATA:  Acute onset of severe shortness of breath. Initial encounter. EXAM: PORTABLE CHEST 1 VIEW COMPARISON:  None. FINDINGS: The lungs are hypoexpanded. Vascular congestion is noted. Right basilar airspace opacity raises concern for pneumonia. There is no evidence of pleural effusion or pneumothorax. The cardiomediastinal silhouette is  enlarged. No acute osseous abnormalities are seen. IMPRESSION: 1. Lungs hypoexpanded. Right basilar airspace opacity raises concern for pneumonia. 2. Vascular congestion and cardiomegaly. Electronically Signed   By: Garald Balding M.D.   On: 07/10/2016 04:02   Personally reviewed RLL infiltrate, low volume.   STUDIES:    CULTURES: BCX2 4/11>>> RVP 4/11>>> Legionella antigen 4/11>>> Strep antigen 4/11>>>  ANTIBIOTICS: Rocephin 4/11>>> azith 4/11>>>  SIGNIFICANT EVENTS:  LINES/TUBES:    ASSESSMENT / PLAN:  Acute hypoxic respiratory failure in setting of CAP, superimposed on MO Plan Cont BIPAP PRN Wean FIO2 PCT algo Day 0/x rocephin and azith  Severe sepsis/septic shock 2/2 above  Plan Repeat LA->if elevated will complete the 8ml/kg bolus Hold antihypertensives See abx above Trend ebc and fever curve   h/o NICM & LV thrombus->last ECHO report "nml EF"; up from 15-20% per [facility] report Plan Tele Holding coreg, lisinopril and norvasc Hold lipitor Cont dig (check level) Coumadin per pharmacy   Acute on  chronic renal failure  (Bl CKD stage III-IV w/ creatinine 2.7 to 3.1) Plan Renal dose meds MAP goal > 65 Holding meds as mentioned above Strict I&O f/u chem in am   Lactic acidosis as well as mild NAGMA Plan IVFs w/ LR to avoid hyperchloremia  Repeat LA MAP goal > 65  Fluid and electrolyte abnormality: Hypomagnesemia & hypokalemia  Plan Replace and recheck  MO Plan NPO PPI for sup   Anemia of chronic disease Chronic anticoagulation  Plan Trend cbc Coumadin per pharmacy   DM (insulin dep) w/ hyperglycemia Plan Hold oral agent SSI   DVT prophylaxis: coumadin therapeutic  SUP: PPI Diet: NPO Activity: bedrest  Disposition : ICU  Discussion This is a 56 year old male w/ CAP, and resultant severe sepsis/septic shock and A on C renal failure. His initial lactic acid was < 2 and hypotension cleared w/ fluid challenge of 700 ml so entire 95ml/kg  was aborted given concern about baseline heart fxn. His RR is elevated and he has a compensatory metabolic acidosis still. I am concerned that he is under-resuscitated given his WOB and think this lactate may have risen again.  Plan -repeat stat lactic acid -IF elevated will complete 30 ml/kg -abx as above -BIPAP -close obs for both circulatory and respiratory decompensation   I updated his wife on plan of care.  Discussed plan of care w/ nursing and Respiratory therapy team   My critical care time 45 minutes.   Erick Colace ACNP-BC [place] Pager # [phone] OR # [phone] if no answer 07/10/2016, 11:56 AM    Attending Note:  I have examined patient, reviewed labs, studies and notes. I have discussed the case with Jerrye Bushy, and I agree with the data and plans as amended above.   Mr. Felkins is 52 with a history of rheumatoid arthritis (not currently on immunosuppression), diabetes, chronic kidney disease, history of a nonischemic cardiopathy with reported improvement on most recent echocardiogram July 2017 based on his notes from outside hospital. He was well into about 2-3 days ago when he developed upper respiratory type symptoms, fever, malaise, congestion. Developed fevers. He notes that he had some sick contacts from grandchildren with viral infections. Unfortunately he experienced progressive dyspnea and evaluation in the emergency department. Chest x-ray shows cardiomegaly and a right lower lobe infiltrate. He was started on BiPAP support in the setting of his infiltrate and also an acute metabolic acidosis. Volume resuscitation and antibiotics initiated for severe sepsis. Initially his volume support was conservative but he had an interval worsening of his acidosis. On my evaluation after resumption of fluid resuscitation and a few hours of BiPAP treatment he is much more comfortable. His blood pressure is improved. He is able to talk to me in complete  sentences with the BiPAP in place. He is alert, interactive, appropriate. His lungs have decreased breath sounds at both bases with some right sided inspiratory crackles. Heart is distant, regular. Abdomen is benign. Trace pretibial edema. Suspect that this is acute on chronic respiratory failure due to to be acquired pneumonia or viral pneumonia, contribution of his metabolic acidosis, possible contribution of obesity or undiagnosed obstructive sleep apnea. We will continue his BiPAP as needed. Attempt to wean off as able. He probably would benefit from wearing it through the night tonight. We will continue antibiotics for community-acquired pneumonia. Volume resuscitation has been initiated and we will recheck a lactic acid to ensure that it is clearing. His blood pressure medications are  currently on hold but we may need to restart these as his current blood pressure shows improvement.  Independent critical care time is 40 minutes.   Baltazar Apo, MD, PhD 07/10/2016, 3:36 PM  Pulmonary and Critical Care [phone] or if no answer [phone]

## 2016-07-10 NOTE — Progress Notes (Signed)
eLink Physician-Brief Progress Note Patient Name: Joshua Fernandez DOB: 12-Jan-1961 MRN: [id number]   Date of Service  07/10/2016  HPI/Events of Note  PNA Resuscitated now and NOW HTN over 200 Add home norvasc and coreg, may need additional IV prn agent  Lactic about same, likley contribution from resp status also, with HTN, no more bolus  eICU Interventions       Intervention Category Major Interventions: Hypertension - evaluation and management  FEINSTEIN,DANIEL J. 07/10/2016, 5:46 PM

## 2016-07-10 NOTE — Progress Notes (Addendum)
ANTICOAGULATION CONSULT NOTE - Initial Consult  Pharmacy Consult for warfarin Indication: h/o LV thrombus  No Known Allergies  Patient Measurements: Height: 5\' 11"  (180.3 cm) Weight: 292 lb 15.9 oz (132.9 kg) IBW/kg (Calculated) : 75.3  Vital Signs: Temp: 97.8 F (36.6 C) (04/11 1200) Temp Source: Oral (04/11 1200) BP: 145/61 (04/11 1205) Pulse Rate: 84 (04/11 1100)  Labs:  Recent Labs  07/10/16 0330 07/10/16 0900  HGB 12.6*  --   HCT 37.4*  --   PLT 236  --   LABPROT 28.9*  --   INR 2.66  --   CREATININE 3.48* 4.10*  TROPONINI <0.03  --     Estimated Creatinine Clearance: 28.3 mL/min (A) (by C-G formula based on SCr of 4.1 mg/dL (H)).   Medical History: Past Medical History:  Diagnosis Date  . AKI (acute kidney injury) ([place])   . Cardiomyopathy ([place])   . Cataract   . CHF (congestive heart failure) ([place])   . Coronary artery disease   . Diabetes mellitus without complication ([place])   . Gout   . Hyperlipemia   . Hyperparathyroidism ([place])   . Hypertension   . Open-angle glaucoma   . Pneumonia   . Renal disorder    chronic kidney disease stage 3  . Rheumatoid arthritis ([place])   . 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 Vitamin D deficiency      Assessment: 80 yoM admitted with acute resp failure, septic shock 2/2 CAP, AoCKD on chronic warfarin PTA for h/o LV thrombus managed by anticoagulation clinic.  Per recent clinic notes, patient is supposed to be on 10 mg daily.  Admission INR therapeutic at 2.66.  Pharmacy consulted to continue management of warfarin while admitted.  Last warfarin dose was 10 mg 4/10 at 10am Hgb 12.6, plts WNL. No bleeding issues documented. Currently NPO.  Drug interactions with warfarin: azithromycin.  Goal of Therapy:  INR 2-3  Plan:  Warfarin 7.5 mg today.  Will give slightly lower dose than home to start considering new drug interactions, NPO status, critically ill with AKI. Daily INR.  Hershal Coria 07/10/2016,1:02 PM

## 2016-07-10 NOTE — Progress Notes (Signed)
Notified Elink of elevated BP - 201/82

## 2016-07-10 NOTE — Progress Notes (Signed)
Pt remains on BIPAP at this time. 

## 2016-07-10 NOTE — Progress Notes (Signed)
CRITICAL VALUE ALERT  Critical value received:  Lactic acid - 2.6  Date of notification:  07/10/2016  Time of notification:  17:20  Critical value read back:Yes.    Nurse who received alert:  Juel Burrow, RN  MD notified (1st page):  Warren Lacy  Time of first page:  17:23  MD notified (2nd page):  Time of second page:  Responding MD:  Warren Lacy  Time MD responded:  17:23

## 2016-07-11 DIAGNOSIS — A419 Sepsis, unspecified organism: Principal | ICD-10-CM

## 2016-07-11 LAB — URINALYSIS, ROUTINE W REFLEX MICROSCOPIC
BILIRUBIN URINE: NEGATIVE
Glucose, UA: 50 mg/dL — AB
KETONES UR: NEGATIVE mg/dL
Leukocytes, UA: NEGATIVE
Nitrite: NEGATIVE
Specific Gravity, Urine: 1.011 (ref 1.005–1.030)
pH: 5 (ref 5.0–8.0)

## 2016-07-11 LAB — BLOOD GAS, ARTERIAL
Acid-base deficit: 4.5 mmol/L — ABNORMAL HIGH (ref 0.0–2.0)
BICARBONATE: 19.9 mmol/L — AB (ref 20.0–28.0)
DELIVERY SYSTEMS: POSITIVE
Drawn by: 308601
Expiratory PAP: 6
FIO2: 50
INSPIRATORY PAP: 20
Mode: POSITIVE
O2 Saturation: 96.6 %
PATIENT TEMPERATURE: 98.6
PO2 ART: 89.5 mmHg (ref 83.0–108.0)
pCO2 arterial: 36.3 mmHg (ref 32.0–48.0)
pH, Arterial: 7.359 (ref 7.350–7.450)

## 2016-07-11 LAB — CBC
HCT: 32.8 % — ABNORMAL LOW (ref 39.0–52.0)
HCT: 35.2 % — ABNORMAL LOW (ref 39.0–52.0)
Hemoglobin: 10.7 g/dL — ABNORMAL LOW (ref 13.0–17.0)
Hemoglobin: 11.6 g/dL — ABNORMAL LOW (ref 13.0–17.0)
MCH: 24.5 pg — AB (ref 26.0–34.0)
MCH: 24.8 pg — AB (ref 26.0–34.0)
MCHC: 32.6 g/dL (ref 30.0–36.0)
MCHC: 33 g/dL (ref 30.0–36.0)
MCV: 75.1 fL — AB (ref 78.0–100.0)
MCV: 75.2 fL — AB (ref 78.0–100.0)
PLATELETS: 190 10*3/uL (ref 150–400)
PLATELETS: 175 10*3/uL (ref 150–400)
RBC: 4.37 MIL/uL (ref 4.22–5.81)
RBC: 4.68 MIL/uL (ref 4.22–5.81)
RDW: 16.2 % — ABNORMAL HIGH (ref 11.5–15.5)
RDW: 16.5 % — AB (ref 11.5–15.5)
WBC: 25 10*3/uL — ABNORMAL HIGH (ref 4.0–10.5)
WBC: 21.3 10*3/uL — ABNORMAL HIGH (ref 4.0–10.5)

## 2016-07-11 LAB — GLUCOSE, CAPILLARY
GLUCOSE-CAPILLARY: 133 mg/dL — AB (ref 65–99)
GLUCOSE-CAPILLARY: 114 mg/dL — AB (ref 65–99)
GLUCOSE-CAPILLARY: 125 mg/dL — AB (ref 65–99)
GLUCOSE-CAPILLARY: 122 mg/dL — AB (ref 65–99)
Glucose-Capillary: 141 mg/dL — ABNORMAL HIGH (ref 65–99)
Glucose-Capillary: 125 mg/dL — ABNORMAL HIGH (ref 65–99)
Glucose-Capillary: 129 mg/dL — ABNORMAL HIGH (ref 65–99)

## 2016-07-11 LAB — TROPONIN I: TROPONIN I: 0.04 ng/mL — AB (ref <0.03)

## 2016-07-11 LAB — BASIC METABOLIC PANEL
Anion gap: 8 (ref 5–15)
BUN: 44 mg/dL — ABNORMAL HIGH (ref 6–20)
CO2: 19 mmol/L — ABNORMAL LOW (ref 22–32)
CREATININE: 3.96 mg/dL — AB (ref 0.61–1.24)
Calcium: 7.5 mg/dL — ABNORMAL LOW (ref 8.9–10.3)
Chloride: 113 mmol/L — ABNORMAL HIGH (ref 101–111)
GFR calc Af Amer: 18 mL/min — ABNORMAL LOW (ref >60)
GFR, EST NON AFRICAN AMERICAN: 16 mL/min — AB (ref >60)
Glucose, Bld: 115 mg/dL — ABNORMAL HIGH (ref 65–99)
POTASSIUM: 3.3 mmol/L — AB (ref 3.5–5.1)
Sodium: 140 mmol/L (ref 135–145)

## 2016-07-11 LAB — PROTIME-INR
INR: 3.59
PROTHROMBIN TIME: 36.7 s — AB (ref 11.4–15.2)

## 2016-07-11 LAB — MAGNESIUM: Magnesium: 1 mg/dL — ABNORMAL LOW (ref 1.7–2.4)

## 2016-07-11 LAB — HEMOGLOBIN A1C
HEMOGLOBIN A1C: 6.6 % — AB (ref 4.8–5.6)
MEAN PLASMA GLUCOSE: 143 mg/dL

## 2016-07-11 LAB — HIV ANTIBODY (ROUTINE TESTING W REFLEX): HIV SCREEN 4TH GENERATION: NONREACTIVE

## 2016-07-11 LAB — STREP PNEUMONIAE URINARY ANTIGEN: STREP PNEUMO URINARY ANTIGEN: NEGATIVE

## 2016-07-11 LAB — PHOSPHORUS: PHOSPHORUS: 2.6 mg/dL (ref 2.5–4.6)

## 2016-07-11 LAB — PROCALCITONIN: Procalcitonin: >150 ng/mL

## 2016-07-11 MED ORDER — POTASSIUM CHLORIDE CRYS ER 20 MEQ PO TBCR: 40.0000 meq | EXTENDED_RELEASE_TABLET | Freq: Once | ORAL | Status: AC

## 2016-07-11 MED ORDER — INSULIN ASPART 100 UNIT/ML ~~LOC~~ SOLN
0.0000 [IU] | Freq: Three times a day (TID) | SUBCUTANEOUS | Status: DC
  Administered 2016-07-11 (×3): 2 [IU] via SUBCUTANEOUS
  Administered 2016-07-12 – 2016-07-13 (×6): 3 [IU] via SUBCUTANEOUS
  Administered 2016-07-13 – 2016-07-14 (×3): 2 [IU] via SUBCUTANEOUS
  Administered 2016-07-14: 3 [IU] via SUBCUTANEOUS
  Administered 2016-07-14 – 2016-07-15 (×2): 2 [IU] via SUBCUTANEOUS

## 2016-07-11 MED ORDER — PROMETHAZINE HCL 25 MG PO TABS
12.5000 mg | ORAL_TABLET | Freq: Four times a day (QID) | ORAL | Status: DC | PRN
  Administered 2016-07-11: 12.5 mg via ORAL
  Filled 2016-07-11: qty 1

## 2016-07-11 MED ORDER — MAGNESIUM SULFATE 2 GM/50ML IV SOLN
2.0000 g | Freq: Once | INTRAVENOUS | Status: AC
  Administered 2016-07-11: 2 g via INTRAVENOUS
  Filled 2016-07-11: qty 50

## 2016-07-11 MED ORDER — ONDANSETRON HCL 4 MG/2ML IJ SOLN
4.0000 mg | Freq: Four times a day (QID) | INTRAMUSCULAR | Status: DC | PRN
  Administered 2016-07-11: 4 mg via INTRAVENOUS
  Filled 2016-07-11 (×2): qty 2

## 2016-07-11 NOTE — Progress Notes (Signed)
Pt remains on HFNC at this time.

## 2016-07-11 NOTE — Progress Notes (Signed)
eLink Physician-Brief Progress Note Patient Name: Joshua Fernandez DOB: 04-23-1960 MRN: [id number]   Date of Service  07/11/2016  HPI/Events of Note  Patient complaining of nausea.   eICU Interventions  Zofran ordered IV as needed      Intervention Category Intermediate Interventions: Other:  Tera Partridge 07/11/2016, 3:36 PM

## 2016-07-11 NOTE — Progress Notes (Signed)
Patient wearing oxygen set at 10lpm high flow at this time. Patient stated he has no shortness of breath at this time. Bipap ordered on prn basis

## 2016-07-11 NOTE — Progress Notes (Signed)
Rt gave pt flutter valve. Pt knows and understands how to use. 

## 2016-07-11 NOTE — Progress Notes (Signed)
Pt off BIPAP and on 10 LPM HF ([place]). No distress noted at this time.

## 2016-07-11 NOTE — Progress Notes (Signed)
PULMONARY / CRITICAL CARE MEDICINE   Name: Joshua Fernandez MRN: [id number] DOB: 01-17-1961    ADMISSION DATE:  07/10/2016 CONSULTATION DATE:  4/11  REFERRING MD:  Rogene Houston   CHIEF COMPLAINT:  Acute respiratory failure and septic shock   SUBJECTIVE:  Feels better. No distress. Now off BIPAP    VITAL SIGNS: BP (!) 151/72 (BP Location: Left Wrist)   Pulse 66   Temp 98.7 F (37.1 C) (Axillary)   Resp 20   Ht 5\' 11"  (1.803 m)   Wt (!) 302 lb (137 kg)   SpO2 98%   BMI 42.12 kg/m   HEMODYNAMICS:    VENTILATOR SETTINGS: FiO2 (%):  [50 %] 50 %  INTAKE / OUTPUT: I/O last 3 completed shifts: In: 5667.5 [P.O.:120; I.V.:1947.5; IV Piggyback:3600] Out: 1375 [Urine:1375]  General appearance:  56 Year old  Male, well nourished/NAD,  Conversant, work of breathing much improved Eyes: anicteric sclerae , moist conjunctivae; PERRL, EOMI bilaterally. Mouth:  membranes and no mucosal ulcerations; normal hard and soft palate Neck: Trachea midline; neck supple, no JVD Lungs/chest: crackles right base.  Now with normal respiratory effort and no intercostal retractions CV: RRR, no MRGs  Abdomen: Soft, non-tender; no masses or HSM Extremities: No peripheral edema or extremity lymphadenopathy Skin: Normal temperature, turgor and texture; no rash, ulcers or subcutaneous nodules Neuro/Psych: Appropriate affect, alert and oriented to person, place and time   LABS:  BMET  Recent Labs Lab 07/10/16 0330 07/10/16 0900 07/11/16 0311  NA 142 139 140  K 2.8* 3.2* 3.3*  CL 114* 109 113*  CO2 19* 18* 19*  BUN 37* 41* 44*  CREATININE 3.48* 4.10* 3.96*  GLUCOSE 142* 183* 115*    Electrolytes  Recent Labs Lab 07/10/16 0330 07/10/16 0900 07/11/16 0311  CALCIUM 8.2* 7.5* 7.5*  MG 1.0*  --  1.0*  PHOS  --   --  2.6    CBC  Recent Labs Lab 07/10/16 0330 07/11/16 0311  WBC 20.9* 25.0*  HGB 12.6* 10.7*  HCT 37.4* 32.8*  PLT 236 190    Coag's  Recent Labs Lab  07/10/16 0330 07/11/16 0311  INR 2.66 3.59    Sepsis Markers  Recent Labs Lab 07/10/16 0829 07/10/16 1223 07/10/16 1229 07/10/16 1622 07/11/16 0311  LATICACIDVEN 1.26 2.3*  --  2.6*  --   PROCALCITON  --   --  >150.00  --  >150.00    ABG  Recent Labs Lab 07/10/16 0412 07/10/16 0828 07/11/16 0400  PHART 7.372 7.346* 7.359  PCO2ART 32.1 29.1* 36.3  PO2ART 197.0* 72.0* 89.5    Liver Enzymes  Recent Labs Lab 07/10/16 0900  AST 21  ALT 22  ALKPHOS 58  BILITOT 1.0  ALBUMIN 2.9*    Cardiac Enzymes  Recent Labs Lab 07/10/16 1229 07/10/16 1857 07/11/16 0019  TROPONINI 0.03* 0.03* 0.04*    Glucose  Recent Labs Lab 07/10/16 1323 07/10/16 1626 07/10/16 1912 07/11/16 0133 07/11/16 0313 07/11/16 0759  GLUCAP 188* 205* 213* 133* 114* 141*    Imaging No results found. Personally reviewed RLL infiltrate a little worse when c/w initial film.   STUDIES:    CULTURES: BCX2 4/11>>> RVP 4/11>>>neg Legionella antigen 4/11>>> Strep antigen 4/11>>>neg  ANTIBIOTICS: Rocephin 4/11>>> azith 4/11>>>  SIGNIFICANT EVENTS:  LINES/TUBES:    ASSESSMENT / PLAN:  Acute hypoxic respiratory failure in setting of CAP, superimposed on MO -clinically improved. Work of breathing and gas exchange better. CXR a little worse c/w initial film. Hope that this is just  clinical lag. WBC count did increase a little hope again that this is clinical lag.  Plan Change BIPAP to PRN Cont current abx day 1/x rocephin and azith Add flutter and IS Cont PCT algo Repeat WBC ct this afternoon. Watch clinically. For now will hold off from widening abx BUT d/t fact PCT has not dropped and WBC ct increased a little if there is any evidence of clinical decline will widen abx  Severe sepsis d/t above-->shock resolved Plan abx per above KVO IVFs  h/o NICM & LV thrombus->last ECHO report "nml EF"; up from 15-20% per [facility] report h/o HTN Plan Added back Norvasc and coreg  Cont  tele  PRN labetolol   Acute on chronic renal failure  (Bl CKD stage III-IV w/ creatinine 2.7 to 3.1) Seems to have hit plateau  Plan Avoid hyper and hypotension Keep even to negative volume status f/u am chemistry   Lactic acidosis as well as mild NAGMA -at this point more driven by hyperchloremia  Plan Allow for free water intake f/u am chemistry   Fluid and electrolyte abnormality: Hypomagnesemia & hypokalemia  Plan Replace and recheck  MO Plan Adv diet   Anemia of chronic disease Chronic anticoagulation  Plan Trend cbc and INR  DM (insulin dep) w/ hyperglycemia Plan Change ssi to AC/HS   DVT prophylaxis:therapeutic warfarin  SUP: pepcid  Diet reg diet  Activity: oob Disposition : SDU setting   Discussion Looks better. Now off BIPAP.  CXR, WBC and PCT lagging behind clinical improvement. Shock resolved.  Will keep in SDU setting. Repeat CBC later this afternoon. Would widen abx if spikes fever, WOB worsens OR wbc still climbing this afternoon, but clinically It looks like we are heading the right direction. [facility]   Erick Colace ACNP-BC [place] Pager # [phone] OR # [phone] if no answer  Attending Note:  I have examined patient, reviewed labs, studies and notes. I have discussed the case with Jerrye Bushy, and I agree with the data and plans as amended above. Mr. Evetts is 56, has a history of rheumatoid arthritis, diabetes, obesity, cardiomegaly with a cardiomyopathy that was improved on most recent echocardiogram 2017. He was admitted 4/11 with acute respiratory failure in the setting of a probable bacterial pneumonia following a URI. He had severe sepsis, persistent metabolic acidosis. This has cleared with more aggressive IV fluid resuscitation. He required BiPAP in the emergency department and wore it overnight last night. His respiratory viral panel is now negative. Blood pressure improved, in fact he became hypertensive  evening 4/11. His blood pressure regimen has been restarted today. On my evaluation he is now off BiPAP. Awake, alert, interactive, moving all extremities. His lungs are distant, continues to have some right sided inspiratory crackles. Heart regular without a murmur, abdomen benign, no significant edema. We will change his BiPAP to when necessary. I question whether he may have obstructive sleep apnea that is undiagnosed, might benefit from positive pressure daily at bedtime. We will continue his current antibiotics, watch for any clinical decompensation. If so we will broaden. Continue Norvasc and Coreg. Coumadin per pharmacy for his LV thrombus. Continue IV resuscitation, follow BMP. We will change to stepdown status 4/12. Independent critical care time is 35 minutes.   Baltazar Apo, MD, PhD 07/11/2016, 9:48 AM [place] [phone] or if no answer [phone]

## 2016-07-11 NOTE — Progress Notes (Addendum)
ANTICOAGULATION CONSULT NOTE - Follow Up Consult  Pharmacy Consult for warfarin Indication: h/o LV thrombus  No Known Allergies  Patient Measurements: Height: 5\' 11"  (180.3 cm) Weight: (!) 302 lb (137 kg) IBW/kg (Calculated) : 75.3  Vital Signs: Temp: 98.8 F (37.1 C) (04/12 0314) Temp Source: Axillary (04/12 0314) BP: 138/90 (04/12 0700) Pulse Rate: 66 (04/12 0400)  Labs:  Recent Labs  07/10/16 0330 07/10/16 0900 07/10/16 1229 07/10/16 1857 07/11/16 0019 07/11/16 0311  HGB 12.6*  --   --   --   --  10.7*  HCT 37.4*  --   --   --   --  32.8*  PLT 236  --   --   --   --  190  LABPROT 28.9*  --   --   --   --  36.7*  INR 2.66  --   --   --   --  3.59  CREATININE 3.48* 4.10*  --   --   --  3.96*  TROPONINI <0.03  --  0.03* 0.03* 0.04*  --     Estimated Creatinine Clearance: 29.8 mL/min (A) (by C-G formula based on SCr of 3.96 mg/dL (H)).   Medical History: Past Medical History:  Diagnosis Date  . AKI (acute kidney injury) ([place])   . Cardiomyopathy ([place])   . Cataract   . CHF (congestive heart failure) ([place])   . Coronary artery disease   . Diabetes mellitus without complication ([place])   . Gout   . Hyperlipemia   . Hyperparathyroidism ([place])   . Hypertension   . Open-angle glaucoma   . Pneumonia   . Renal disorder    chronic kidney disease stage 3  . Rheumatoid arthritis ([place])   . 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 Vitamin D deficiency     Assessment: 35 yoM admitted with acute resp failure, septic shock 2/2 CAP, AoCKD on chronic warfarin PTA for h/o LV thrombus managed by anticoagulation clinic.  Per recent clinic notes, patient is supposed to be on 10 mg daily.  Admission INR therapeutic at 2.66.  Pharmacy consulted to continue management of warfarin while admitted.  Today, 07/11/2016: - INR supratherapeutic at 3.59. Was given 7.5 mg yesterday, a slightly lower dose than home, in consideration of new drug interactions, NPO status,  critically ill with AKI.  Will need to hold dose today. - Hgb 10.7, plts WNL. No bleeding issues documented. - Drug interactions with warfarin: azithromycin.  Goal of Therapy:  INR 2-3  Plan:  No warfarin today. Daily INR.  Hershal Coria 07/11/2016,7:56 AM

## 2016-07-11 NOTE — Care Management Note (Signed)
Case Management Note  Patient Details  Name: Braven Wolk MRN: [id number] Date of Birth: Dec 30, 1960  Subjective/Objective:      resp failure              Action/Plan: Date:  July 11, 2016 Chart reviewed for concurrent status and case management needs. Will continue to follow patient progress. Discharge Planning: following for needs Expected discharge date: [id number] Velva Harman, BSN, [place], [place]  Expected Discharge Date:                  Expected Discharge Plan:  Home/Self Care  In-House Referral:     Discharge planning Services     Post Acute Care Choice:    Choice offered to:     DME Arranged:    DME Agency:     HH Arranged:    [place] Agency:     Status of Service:  In process, will continue to follow  If discussed at Long Length of Stay Meetings, dates discussed:    Additional Comments:  Leeroy Cha, RN 07/11/2016, 10:18 AM

## 2016-07-11 NOTE — Progress Notes (Signed)
Pt remains off BIPAP and on 10 LPM HF stas 91%.

## 2016-07-11 NOTE — Progress Notes (Signed)
[facility] Progress Note Patient Name: Evart Mcdonnell DOB: 1960/10/25 MRN: [id number]   Date of Service  07/11/2016  HPI/Events of Note  Patient complaining of nausea not relieved with Zofran.   eICU Interventions  Phenergan 12.5 mg by mouth every 6 hours when necessary nausea      Intervention Category Intermediate Interventions: Other:  Tera Partridge 07/11/2016, 9:26 PM

## 2016-07-12 DIAGNOSIS — N183 Chronic kidney disease, stage 3 unspecified: Secondary | ICD-10-CM | POA: Diagnosis present

## 2016-07-12 DIAGNOSIS — E876 Hypokalemia: Secondary | ICD-10-CM | POA: Diagnosis present

## 2016-07-12 DIAGNOSIS — J189 Pneumonia, unspecified organism: Secondary | ICD-10-CM | POA: Diagnosis present

## 2016-07-12 DIAGNOSIS — R0600 Dyspnea, unspecified: Secondary | ICD-10-CM

## 2016-07-12 DIAGNOSIS — J9601 Acute respiratory failure with hypoxia: Secondary | ICD-10-CM | POA: Diagnosis present

## 2016-07-12 LAB — GLUCOSE, CAPILLARY
GLUCOSE-CAPILLARY: 120 mg/dL — AB (ref 65–99)
GLUCOSE-CAPILLARY: 184 mg/dL — AB (ref 65–99)
GLUCOSE-CAPILLARY: 181 mg/dL — AB (ref 65–99)
GLUCOSE-CAPILLARY: 154 mg/dL — AB (ref 65–99)

## 2016-07-12 LAB — COMPREHENSIVE METABOLIC PANEL
ALBUMIN: 2.6 g/dL — AB (ref 3.5–5.0)
ALK PHOS: 64 U/L (ref 38–126)
ALT: 33 U/L (ref 17–63)
AST: 32 U/L (ref 15–41)
Anion gap: 8 (ref 5–15)
BILIRUBIN TOTAL: 0.7 mg/dL (ref 0.3–1.2)
BUN: 41 mg/dL — ABNORMAL HIGH (ref 6–20)
CALCIUM: 8.2 mg/dL — AB (ref 8.9–10.3)
CO2: 20 mmol/L — ABNORMAL LOW (ref 22–32)
Chloride: 114 mmol/L — ABNORMAL HIGH (ref 101–111)
Creatinine, Ser: 3.6 mg/dL — ABNORMAL HIGH (ref 0.61–1.24)
GFR calc Af Amer: 20 mL/min — ABNORMAL LOW (ref >60)
GFR calc non Af Amer: 18 mL/min — ABNORMAL LOW (ref >60)
GLUCOSE: 117 mg/dL — AB (ref 65–99)
POTASSIUM: 3.6 mmol/L (ref 3.5–5.1)
Sodium: 142 mmol/L (ref 135–145)
TOTAL PROTEIN: 6.5 g/dL (ref 6.5–8.1)

## 2016-07-12 LAB — LEGIONELLA PNEUMOPHILA SEROGP 1 UR AG: L. pneumophila Serogp 1 Ur Ag: NEGATIVE

## 2016-07-12 LAB — CBC
HEMATOCRIT: 33.4 % — AB (ref 39.0–52.0)
Hemoglobin: 11 g/dL — ABNORMAL LOW (ref 13.0–17.0)
MCH: 24.8 pg — ABNORMAL LOW (ref 26.0–34.0)
MCHC: 32.9 g/dL (ref 30.0–36.0)
MCV: 75.2 fL — ABNORMAL LOW (ref 78.0–100.0)
Platelets: 187 10*3/uL (ref 150–400)
RBC: 4.44 MIL/uL (ref 4.22–5.81)
RDW: 16.8 % — ABNORMAL HIGH (ref 11.5–15.5)
WBC: 21.4 10*3/uL — AB (ref 4.0–10.5)

## 2016-07-12 LAB — PROTIME-INR
INR: 3.64
Prothrombin Time: 37.1 seconds — ABNORMAL HIGH (ref 11.4–15.2)

## 2016-07-12 LAB — PROCALCITONIN: Procalcitonin: >150 ng/mL

## 2016-07-12 NOTE — Progress Notes (Addendum)
ANTICOAGULATION CONSULT NOTE - Follow Up Consult  Pharmacy Consult for warfarin Indication: h/o LV thrombus  No Known Allergies  Patient Measurements: Height: 5\' 11"  (180.3 cm) Weight: (!) 302 lb (137 kg) IBW/kg (Calculated) : 75.3  Vital Signs: Temp: 98 F (36.7 C) (04/13 0848) Temp Source: Oral (04/13 0848) BP: 132/65 (04/13 0800) Pulse Rate: 67 (04/13 0226)  Labs:  Recent Labs  07/10/16 0330 07/10/16 0900 07/10/16 1229 07/10/16 1857 07/11/16 0019 07/11/16 0311 07/11/16 1352 07/12/16 0311  HGB 12.6*  --   --   --   --  10.7* 11.6* 11.0*  HCT 37.4*  --   --   --   --  32.8* 35.2* 33.4*  PLT 236  --   --   --   --  190 175 187  LABPROT 28.9*  --   --   --   --  36.7*  --  37.1*  INR 2.66  --   --   --   --  3.59  --  3.64  CREATININE 3.48* 4.10*  --   --   --  3.96*  --  3.60*  TROPONINI <0.03  --  0.03* 0.03* 0.04*  --   --   --     Estimated Creatinine Clearance: 32.8 mL/min (A) (by C-G formula based on SCr of 3.6 mg/dL (H)).  Assessment: 44 yoM admitted with acute resp failure, septic shock 2/2 CAP, AoCKD on chronic warfarin PTA for h/o LV thrombus managed by anticoagulation clinic.  Per recent clinic notes, patient is supposed to be on 10 mg daily.  Admission INR therapeutic at 2.66.  Pharmacy consulted to continue management of warfarin while admitted.  Today, 07/12/2016:  INR supratherapeutic at 3.64. No dose order/given 4/12  Given 7.5 mg 4/11, lower dose than home, in consideration of new drug interactions, NPO status, critically ill with AKI.  CBC: Hgb decreased/stable, plts WNL. No bleeding issues documented.  Drug interactions with warfarin: azithromycin.  Cardiac diet  Goal of Therapy:  INR 2-3  Plan:   No warfarin today for supratherapeutic INR  Daily INR  Doreene Eland, PharmD, BCPS.   Pager: [phone] 07/12/2016 8:55 AM

## 2016-07-12 NOTE — Progress Notes (Signed)
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  Presents to ER at [facility] w/ 2d h/o what was initially described as URI symptoms, subjective fever, general malaise, head congestion, sore throat & cough. Symptoms worsened, coughing worse. On arrival to ER was temp 101.6, noted to be hypoxic w/ increased work of breathing which ultimately required NIPPV. CXR showed RLL infiltrate. Was to be admitted to SDU. While awaiting bed SBP dropped to 80s. Received 700 ml bolus and SBP improved to 136. Because of this 7ml/kg bolus was held off. He did receive abx in the ER. Because of his hypotension it was decided that he should be admitted to the intensive care for close observation.   Assessment & Plan:   Principal Problem:   Sepsis ([place]) Active Problems:   Community acquired pneumonia   Acute respiratory failure with hypoxia ([place])   Hypokalemia   Hypomagnesemia   CKD (chronic kidney disease), stage III   Sepsis -Patient presented with fever of 101.6, WBC is 20.9 and presence of pneumonia. -Received aggressive hydration with IV fluids. -Lactic acid was 2.6 suggestive hypoperfusion, pro-calcitonin >150 -Started on Rocephin and azithromycin for community-acquired pneumonia. -Sepsis physiology improving, both fever and leukocytosis.  Acute respiratory failure with hypoxia -Required 4 L of oxygen to keep oxygen saturation above 90%. -This is secondary to community-acquired pneumonia, this is improving, try to wean to room air.  Community acquired pneumonia -Started on  Rocephin and Zithromax, overall he is improving. -Continue supportive management with bronchodilators, mucolytics, antitussives and oxygen as needed.  Hypokalemia and hypomagnesemia -Potassium was 2.8 and magnesium was 1.0, both repleted with oral and parenteral supplements.  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 -Creatinine peak was 4.1, this is improving to 3.6. Continue to hold nephrotoxic medications including lisinopril.  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 -Restart diuretics when more appropriate.   DVT prophylaxis:  Code Status: Full Code Family Communication:  Disposition Plan:  Diet: Diet Heart Room service appropriate? Yes; Fluid consistency: Thin  Consultants:   None, Was under PCCM  Procedures:   None  Antimicrobials:   Rocephin and Zithromax  Objective: Vitals:   07/12/16 1006 07/12/16 1137 07/12/16 1417 07/12/16 1500  BP: (!) 166/93 (!) 127/59 115/87   Pulse:  69 74   Resp: (!) 24 (!) 24 20   Temp:  98 F (36.7 C) 98 F (36.7 C)   TempSrc:  Oral Oral   SpO2: 95% 95% 95% 94%  Weight:      Height:        Intake/Output Summary (Last 24 hours) at 07/12/16 1556 Last data filed at 07/12/16 1418  Gross per 24 hour  Intake              550 ml  Output                0 ml  Net              550 ml  Filed Weights   07/10/16 1200 07/11/16 0500 07/12/16 0500  Weight: 132.9 kg (292 lb 15.9 oz) (!) 137 kg (302 lb) (!) 137 kg (302 lb)    Examination: General exam: Appears calm and comfortable  Respiratory system: Clear to auscultation. Respiratory effort normal. Cardiovascular system: S1 & S2 heard, RRR. No JVD, murmurs, rubs, gallops or clicks. No pedal edema. Gastrointestinal system: Abdomen is nondistended, soft and nontender. No organomegaly or masses felt. Normal bowel sounds heard. Central nervous system: Alert and oriented. No  focal neurological deficits. Extremities: Symmetric 5 x 5 power. Skin: No rashes, lesions or ulcers Psychiatry: Judgement and insight appear normal. Mood & affect appropriate.   Data Reviewed: I have personally reviewed following labs and imaging studies  CBC:  Recent Labs Lab 07/10/16 0330 07/11/16 0311 07/11/16 1352 07/12/16 0311  WBC 20.9* 25.0* 21.3* 21.4*  NEUTROABS 17.1*  --   --   --   HGB 12.6* 10.7* 11.6* 11.0*  HCT 37.4* 32.8* 35.2* 33.4*  MCV 74.2* 75.1* 75.2* 75.2*  PLT 236 190 175 962   Basic Metabolic Panel:  Recent Labs Lab 07/10/16 0330 07/10/16 0900 07/11/16 0311 07/12/16 0311  NA 142 139 140 142  K 2.8* 3.2* 3.3* 3.6  CL 114* 109 113* 114*  CO2 19* 18* 19* 20*  GLUCOSE 142* 183* 115* 117*  BUN 37* 41* 44* 41*  CREATININE 3.48* 4.10* 3.96* 3.60*  CALCIUM 8.2* 7.5* 7.5* 8.2*  MG 1.0*  --  1.0*  --   PHOS  --   --  2.6  --    GFR: Estimated Creatinine Clearance: 32.8 mL/min (A) (by C-G formula based on SCr of 3.6 mg/dL (H)). Liver Function Tests:  Recent Labs Lab 07/10/16 0900 07/12/16 0311  AST 21 32  ALT 22 33  ALKPHOS 58 64  BILITOT 1.0 0.7  PROT 6.5 6.5  ALBUMIN 2.9* 2.6*   No results for input(s): LIPASE, AMYLASE in the last 168 hours. No results for input(s): AMMONIA in the last 168 hours. Coagulation Profile:  Recent Labs Lab 07/10/16 0330 07/11/16 0311 07/12/16 0311  INR 2.66 3.59 3.64   Cardiac Enzymes:  Recent Labs Lab 07/10/16 0330 07/10/16 1229 07/10/16 1857 07/11/16 0019  TROPONINI <0.03 0.03* 0.03* 0.04*   BNP (last 3 results) No results for input(s): PROBNP in the last 8760 hours. HbA1C:  Recent Labs  07/10/16 1229  HGBA1C 6.6*   CBG:  Recent Labs Lab 07/11/16 1518 07/11/16 1753 07/11/16 2128 07/12/16 0828 07/12/16 1150  GLUCAP 125* 129* 122* 120* 184*   Lipid Profile: No results for input(s): CHOL, HDL, LDLCALC, TRIG, CHOLHDL, LDLDIRECT in the last 72 hours. Thyroid Function Tests: No  results for input(s): TSH, T4TOTAL, FREET4, T3FREE, THYROIDAB in the last 72 hours. Anemia Panel: No results for input(s): VITAMINB12, FOLATE, FERRITIN, TIBC, IRON, RETICCTPCT in the last 72 hours. Urine analysis:    Component Value Date/Time   COLORURINE YELLOW 07/11/2016 0001   APPEARANCEUR CLOUDY (A) 07/11/2016 0001   LABSPEC 1.011 07/11/2016 0001   PHURINE 5.0 07/11/2016 0001   GLUCOSEU 50 (A) 07/11/2016 0001   HGBUR MODERATE (A) 07/11/2016 0001   BILIRUBINUR NEGATIVE 07/11/2016 0001   KETONESUR NEGATIVE 07/11/2016 0001   PROTEINUR >=300 (A) 07/11/2016 0001   NITRITE NEGATIVE 07/11/2016 0001   LEUKOCYTESUR NEGATIVE 07/11/2016 0001   Sepsis Labs: @LABRCNTIP (procalcitonin:4,lacticidven:4)  ) Recent Results (from the past 240 hour(s))  Blood culture (routine x 2)     Status: None (Preliminary result)   Collection Time:  07/10/16  5:05 AM  Result Value Ref Range Status   Specimen Description BLOOD RIGHT ARM  Final   Special Requests   Final    BOTTLES DRAWN AEROBIC AND ANAEROBIC Blood Culture adequate volume   Culture   Final    NO GROWTH 2 DAYS Performed at [facility], [address], [place], [place] [address]    Report Status PENDING  Incomplete  Blood Culture (routine x 2)     Status: None (Preliminary result)   Collection Time: 07/10/16  8:20 AM  Result Value Ref Range Status   Specimen Description BLOOD RIGHT ASSIST CONTROL  Final   Special Requests   Final    BOTTLES DRAWN AEROBIC AND ANAEROBIC Blood Culture adequate volume   Culture   Final    NO GROWTH 2 DAYS Performed at [facility], [place] [address] [place], [place] [address]    Report Status PENDING  Incomplete  C difficile quick scan w PCR reflex     Status: None   Collection Time: 07/10/16  8:56 AM  Result Value Ref Range Status   C Diff antigen NEGATIVE NEGATIVE Final   C Diff toxin NEGATIVE NEGATIVE Final   C Diff interpretation No C. difficile detected.  Final    Comment: Performed at  [facility], [place] [address], [place], [place] [address]  MRSA PCR Screening     Status: None   Collection Time: 07/10/16 12:09 PM  Result Value Ref Range Status   MRSA by PCR NEGATIVE NEGATIVE Final    Comment:        The GeneXpert MRSA Assay (FDA approved for NASAL specimens only), is one component of a comprehensive MRSA colonization surveillance program. It is not intended to diagnose MRSA infection nor to guide or monitor treatment for MRSA infections.   Respiratory Panel by PCR     Status: None   Collection Time: 07/10/16 12:09 PM  Result Value Ref Range Status   Adenovirus NOT DETECTED NOT DETECTED Final   Coronavirus 229E NOT DETECTED NOT DETECTED Final   Coronavirus HKU1 NOT DETECTED NOT DETECTED Final   Coronavirus NL63 NOT DETECTED NOT DETECTED Final   Coronavirus OC43 NOT DETECTED NOT DETECTED Final   Metapneumovirus NOT DETECTED NOT DETECTED Final   Rhinovirus / Enterovirus NOT DETECTED NOT DETECTED Final   Influenza A NOT DETECTED NOT DETECTED Final   Influenza B NOT DETECTED NOT DETECTED Final   Parainfluenza Virus 1 NOT DETECTED NOT DETECTED Final   Parainfluenza Virus 2 NOT DETECTED NOT DETECTED Final   Parainfluenza Virus 3 NOT DETECTED NOT DETECTED Final   Parainfluenza Virus 4 NOT DETECTED NOT DETECTED Final   Respiratory Syncytial Virus NOT DETECTED NOT DETECTED Final   Bordetella pertussis NOT DETECTED NOT DETECTED Final   Chlamydophila pneumoniae NOT DETECTED NOT DETECTED Final   Mycoplasma pneumoniae NOT DETECTED NOT DETECTED Final    Comment: Performed at [facility], [place] [address] [place], [place] [address]     Invalid input(s): PROCALCITONIN, LACTICACIDVEN   Radiology Studies: No results found.      Scheduled Meds: . amLODipine  10 mg Oral Daily  . azithromycin (ZITHROMAX) 500 MG IVPB  500 mg Intravenous Q24H  . carvedilol  25 mg Oral BID WC  . cefTRIAXone (ROCEPHIN)  IV  1 g Intravenous Q24H  . insulin aspart  0-15 Units  Subcutaneous TID AC & HS  . Warfarin - Pharmacist Dosing Inpatient   Does not apply  q1800   Continuous Infusions: . lactated ringers 10 mL/hr at 07/12/16 1000     LOS: 2 days    Time spent: 35 minutes    Joshua Fernandez A, MD [facility] Pager [phone]  If 7PM-7AM, please contact night-coverage [email or web address] Password [facility] 07/12/2016, 3:56 PM

## 2016-07-13 LAB — PROTIME-INR
INR: 2.57
PROTHROMBIN TIME: 28.1 s — AB (ref 11.4–15.2)

## 2016-07-13 LAB — GLUCOSE, CAPILLARY
GLUCOSE-CAPILLARY: 147 mg/dL — AB (ref 65–99)
GLUCOSE-CAPILLARY: 152 mg/dL — AB (ref 65–99)
Glucose-Capillary: 152 mg/dL — ABNORMAL HIGH (ref 65–99)
Glucose-Capillary: 152 mg/dL — ABNORMAL HIGH (ref 65–99)

## 2016-07-13 MED ORDER — POTASSIUM CHLORIDE CRYS ER 20 MEQ PO TBCR
40.0000 meq | EXTENDED_RELEASE_TABLET | Freq: Once | ORAL | Status: AC
  Administered 2016-07-13: 40 meq via ORAL
  Filled 2016-07-13: qty 2

## 2016-07-13 MED ORDER — IPRATROPIUM-ALBUTEROL 0.5-2.5 (3) MG/3ML IN SOLN
3.0000 mL | RESPIRATORY_TRACT | Status: DC
  Administered 2016-07-13 (×3): 3 mL via RESPIRATORY_TRACT
  Filled 2016-07-13 (×3): qty 3

## 2016-07-13 MED ORDER — IPRATROPIUM-ALBUTEROL 0.5-2.5 (3) MG/3ML IN SOLN: 3.0000 mL | RESPIRATORY_TRACT | Status: DC | PRN

## 2016-07-13 MED ORDER — MAGNESIUM SULFATE 2 GM/50ML IV SOLN
2.0000 g | Freq: Once | INTRAVENOUS | Status: AC
  Administered 2016-07-13: 2 g via INTRAVENOUS
  Filled 2016-07-13: qty 50

## 2016-07-13 MED ORDER — WARFARIN SODIUM 5 MG PO TABS
5.0000 mg | ORAL_TABLET | Freq: Once | ORAL | Status: AC
  Administered 2016-07-13: 5 mg via ORAL
  Filled 2016-07-13: qty 1

## 2016-07-13 MED ORDER — FUROSEMIDE 10 MG/ML IJ SOLN
40.0000 mg | Freq: Once | INTRAMUSCULAR | Status: AC
  Administered 2016-07-13: 40 mg via INTRAVENOUS
  Filled 2016-07-13: qty 4

## 2016-07-13 MED ORDER — GUAIFENESIN ER 600 MG PO TB12
1200.0000 mg | ORAL_TABLET | Freq: Two times a day (BID) | ORAL | Status: DC
  Administered 2016-07-13 – 2016-07-15 (×5): 1200 mg via ORAL
  Filled 2016-07-13 (×5): qty 2

## 2016-07-13 MED ORDER — IPRATROPIUM-ALBUTEROL 0.5-2.5 (3) MG/3ML IN SOLN
3.0000 mL | Freq: Four times a day (QID) | RESPIRATORY_TRACT | Status: DC
  Administered 2016-07-14 (×4): 3 mL via RESPIRATORY_TRACT
  Filled 2016-07-13 (×4): qty 3

## 2016-07-13 NOTE — Progress Notes (Signed)
ANTICOAGULATION CONSULT NOTE - Follow Up Consult  Pharmacy Consult for warfarin Indication: h/o LV thrombus  No Known Allergies  Patient Measurements: Height: 5\' 11"  (180.3 cm) Weight: 298 lb 14.4 oz (135.6 kg) IBW/kg (Calculated) : 75.3  Vital Signs: Temp: 98 F (36.7 C) (04/14 0800) Temp Source: Oral (04/14 0800) BP: 149/72 (04/14 0800) Pulse Rate: 65 (04/14 0800)  Labs:  Recent Labs  07/10/16 0900 07/10/16 1229 07/10/16 1857 07/11/16 0019  07/11/16 0311 07/11/16 1352 07/12/16 0311 07/13/16 0508  HGB  --   --   --   --   < > 10.7* 11.6* 11.0*  --   HCT  --   --   --   --   --  32.8* 35.2* 33.4*  --   PLT  --   --   --   --   --  190 175 187  --   LABPROT  --   --   --   --   --  36.7*  --  37.1* 28.1*  INR  --   --   --   --   --  3.59  --  3.64 2.57  CREATININE 4.10*  --   --   --   --  3.96*  --  3.60*  --   TROPONINI  --  0.03* 0.03* 0.04*  --   --   --   --   --   < > = values in this interval not displayed.  Estimated Creatinine Clearance: 32.6 mL/min (A) (by C-G formula based on SCr of 3.6 mg/dL (H)).  Assessment: 38 yoM admitted with acute resp failure, septic shock 2/2 CAP, AoCKD on chronic warfarin PTA for h/o LV thrombus managed by anticoagulation clinic.  Per recent clinic notes, patient is supposed to be on 10 mg daily.  Admission INR therapeutic at 2.66.  Pharmacy consulted to continue management of warfarin while admitted.  Today, 07/13/2016:  INR now therapeutic at 2.57. (dose held on 4/12 and 4/12 d/t supratherapeutic INR)  CBC: Hgb decreased/stable, plts WNL with last labs on 4/13.   No bleeding issues documented.  Drug interactions with warfarin: azithromycin.  Cardiac diet  Goal of Therapy:  INR 2-3  Plan:   warfarin 5 mg PO x1  Daily INR  Dia Sitter, PharmD, BCPS 07/13/2016 8:56 AM

## 2016-07-13 NOTE — Progress Notes (Signed)
SATURATION QUALIFICATIONS: (This note is used to comply with regulatory documentation for home oxygen)  Patient Saturations on Room Air at Rest = 92%  Patient Saturations on Room Air while Ambulating = 84%  Patient Saturations on 4 Liters of oxygen while Ambulating = 87%  Please briefly explain why patient needs home oxygen:

## 2016-07-13 NOTE — Progress Notes (Signed)
SATURATION QUALIFICATIONS: (This note is used to comply with regulatory documentation for home oxygen)  Patient Saturations on Room Air at Rest = 92%  Patient Saturations on Room Air while Ambulating = 84 - 87%  Patient Saturations on 4 Liters of oxygen while Ambulating = 90-92%  Please briefly explain why patient needs home oxygen:

## 2016-07-13 NOTE — Progress Notes (Signed)
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  Presents to ER at [facility] w/ 2d h/o what was initially described as URI symptoms, subjective fever, general malaise, head congestion, sore throat & cough. Symptoms worsened, coughing worse. On arrival to ER was temp 101.6, noted to be hypoxic w/ increased work of breathing which ultimately required NIPPV. CXR showed RLL infiltrate. Was to be admitted to SDU. While awaiting bed SBP dropped to 80s. Received 700 ml bolus and SBP improved to 136. Because of this 58ml/kg bolus was held off. He did receive abx in the ER. Because of his hypotension it was decided that he should be admitted to the intensive care for close observation.   Assessment & Plan:   Principal Problem:   Sepsis ([place]) Active Problems:   Community acquired pneumonia   Acute respiratory failure with hypoxia ([place])   Hypokalemia   Hypomagnesemia   CKD (chronic kidney disease), stage III   Sepsis -Patient presented with fever of 101.6, WBC is 20.9 and presence of pneumonia. -Received aggressive hydration with IV fluids. -Lactic acid was 2.6 suggestive of hypoperfusion and end organ damage, pro-calcitonin >150 -Started on Rocephin and azithromycin for community-acquired pneumonia. -Sepsis physiology resolved, fever and leukocytosis resolved. Check CBC for WBC in a.m.  Acute respiratory failure with hypoxia -Required 4 L of oxygen to keep oxygen saturation above 90%. -This is secondary to community-acquired pneumonia, this is improving, try to wean to room  air.  Community acquired pneumonia -Started on Rocephin and Zithromax, overall he is improving. -Continue supportive management with bronchodilators, mucolytics, antitussives and oxygen as needed.  Hypokalemia and hypomagnesemia -Potassium was 2.8 and magnesium was 1.0, both repleted with oral and parenteral supplements.  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 -Creatinine peak was 4.1, this is improving to 3.6. Continue to hold nephrotoxic medications including lisinopril. -Check BMP in a.m.  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 -Restart diuretics when more appropriate. -Given IV Lasix even intake/output, he is positive by 5 L since admission.   DVT prophylaxis:  Code Status: Full Code Family Communication:  Disposition Plan:  Diet: Diet Heart Room service appropriate? Yes; Fluid consistency: Thin  Consultants:   None, Was under PCCM  Procedures:   None  Antimicrobials:   Rocephin and Zithromax  Objective: Vitals:   07/13/16 0500 07/13/16 0622 07/13/16 0800 07/13/16 0900  BP:  139/66 (!) 149/72 (!) 148/68  Pulse:  72 65 69  Resp:  (!) 22 (!) 24 (!) 24  Temp:  98 F (36.7 C) 98 F (36.7 C) 97.8 F (36.6 C)  TempSrc:  Oral Oral Oral  SpO2:  100% 97% 93%  Weight: 135.6 kg (298 lb 14.4 oz)     Height:        Intake/Output Summary (Last 24 hours) at 07/13/16 1139 Last data filed at 07/13/16 0900  Gross per 24 hour  Intake             1038 ml  Output                0 ml  Net             1038 ml   Filed Weights   07/11/16 0500 07/12/16 0500 07/13/16 0500  Weight: (!) 137 kg (302 lb) (!) 137 kg (302 lb) 135.6 kg (298 lb 14.4 oz)    Examination: General exam: Appears calm and comfortable  Respiratory system: Clear to auscultation. Respiratory effort normal. Cardiovascular system: S1 & S2 heard, RRR. No JVD, murmurs, rubs, gallops or  clicks. No pedal edema. Gastrointestinal system: Abdomen is nondistended, soft and nontender. No organomegaly or masses felt. Normal bowel sounds heard. Central nervous system: Alert and oriented. No focal neurological deficits. Extremities: Symmetric 5 x 5 power. Skin: No rashes, lesions or ulcers Psychiatry: Judgement and insight appear normal. Mood & affect appropriate.   Data Reviewed: I have personally reviewed following labs and imaging studies  CBC:  Recent Labs Lab 07/10/16 0330 07/11/16 0311 07/11/16 1352 07/12/16 0311  WBC 20.9* 25.0* 21.3* 21.4*  NEUTROABS 17.1*  --   --   --   HGB 12.6* 10.7* 11.6* 11.0*  HCT 37.4* 32.8* 35.2* 33.4*  MCV 74.2* 75.1* 75.2* 75.2*  PLT 236 190 175 956   Basic Metabolic Panel:  Recent Labs Lab 07/10/16 0330 07/10/16 0900 07/11/16 0311 07/12/16 0311  NA 142 139 140 142  K 2.8* 3.2* 3.3* 3.6  CL 114* 109 113* 114*  CO2 19* 18* 19* 20*  GLUCOSE 142* 183* 115* 117*  BUN 37* 41* 44* 41*  CREATININE 3.48* 4.10* 3.96* 3.60*  CALCIUM 8.2* 7.5* 7.5* 8.2*  MG 1.0*  --  1.0*  --   PHOS  --   --  2.6  --    GFR: Estimated Creatinine Clearance: 32.6 mL/min (A) (by C-G formula based on SCr of 3.6 mg/dL (H)). Liver Function Tests:  Recent Labs Lab 07/10/16 0900 07/12/16 0311  AST 21 32  ALT 22 33  ALKPHOS 58 64  BILITOT 1.0 0.7  PROT 6.5 6.5  ALBUMIN 2.9* 2.6*   No results for input(s): LIPASE, AMYLASE in the last 168 hours. No results for input(s): AMMONIA in the last 168 hours. Coagulation Profile:  Recent Labs Lab 07/10/16 0330 07/11/16 0311 07/12/16 0311 07/13/16 0508  INR 2.66 3.59 3.64 2.57   Cardiac Enzymes:  Recent Labs Lab 07/10/16 0330 07/10/16 1229 07/10/16 1857 07/11/16 0019  TROPONINI <0.03 0.03* 0.03* 0.04*   BNP (last 3 results) No results for input(s): PROBNP in the last 8760 hours. HbA1C:  Recent Labs  07/10/16 1229  HGBA1C 6.6*   CBG:  Recent Labs Lab 07/12/16 1150 07/12/16 1617  07/12/16 2024 07/13/16 0726 07/13/16 1122  GLUCAP 184* 181* 154* 147* 152*   Lipid Profile: No results for input(s): CHOL, HDL, LDLCALC, TRIG, CHOLHDL, LDLDIRECT in the last 72 hours. Thyroid Function Tests: No results for input(s): TSH, T4TOTAL, FREET4, T3FREE, THYROIDAB in the last 72 hours. Anemia Panel: No results for input(s): VITAMINB12, FOLATE, FERRITIN, TIBC, IRON, RETICCTPCT in the last 72 hours. Urine analysis:    Component Value Date/Time   COLORURINE YELLOW 07/11/2016 0001   APPEARANCEUR CLOUDY (A) 07/11/2016 0001   LABSPEC 1.011 07/11/2016 0001   PHURINE 5.0 07/11/2016 0001   GLUCOSEU 50 (A) 07/11/2016 0001   HGBUR MODERATE (A) 07/11/2016 0001   BILIRUBINUR NEGATIVE 07/11/2016 0001   KETONESUR NEGATIVE 07/11/2016 0001   PROTEINUR >=300 (A) 07/11/2016 0001   NITRITE NEGATIVE 07/11/2016  0001   LEUKOCYTESUR NEGATIVE 07/11/2016 0001   Sepsis Labs: @LABRCNTIP (procalcitonin:4,lacticidven:4)  ) Recent Results (from the past 240 hour(s))  Blood culture (routine x 2)     Status: None (Preliminary result)   Collection Time: 07/10/16  5:05 AM  Result Value Ref Range Status   Specimen Description BLOOD RIGHT ARM  Final   Special Requests   Final    BOTTLES DRAWN AEROBIC AND ANAEROBIC Blood Culture adequate volume   Culture   Final    NO GROWTH 3 DAYS Performed at [facility], [place] [address], [place], [place] [address]    Report Status PENDING  Incomplete  Blood Culture (routine x 2)     Status: None (Preliminary result)   Collection Time: 07/10/16  8:20 AM  Result Value Ref Range Status   Specimen Description BLOOD RIGHT ASSIST CONTROL  Final   Special Requests   Final    BOTTLES DRAWN AEROBIC AND ANAEROBIC Blood Culture adequate volume   Culture   Final    NO GROWTH 3 DAYS Performed at [facility], [place] [address], [place], [place] [address]    Report Status PENDING  Incomplete  C difficile quick scan w PCR reflex     Status: None   Collection  Time: 07/10/16  8:56 AM  Result Value Ref Range Status   C Diff antigen NEGATIVE NEGATIVE Final   C Diff toxin NEGATIVE NEGATIVE Final   C Diff interpretation No C. difficile detected.  Final    Comment: Performed at [facility], [place] [address], [place], [place] [address]  MRSA PCR Screening     Status: None   Collection Time: 07/10/16 12:09 PM  Result Value Ref Range Status   MRSA by PCR NEGATIVE NEGATIVE Final    Comment:        The GeneXpert MRSA Assay (FDA approved for NASAL specimens only), is one component of a comprehensive MRSA colonization surveillance program. It is not intended to diagnose MRSA infection nor to guide or monitor treatment for MRSA infections.   Respiratory Panel by PCR     Status: None   Collection Time: 07/10/16 12:09 PM  Result Value Ref Range Status   Adenovirus NOT DETECTED NOT DETECTED Final   Coronavirus 229E NOT DETECTED NOT DETECTED Final   Coronavirus HKU1 NOT DETECTED NOT DETECTED Final   Coronavirus NL63 NOT DETECTED NOT DETECTED Final   Coronavirus OC43 NOT DETECTED NOT DETECTED Final   Metapneumovirus NOT DETECTED NOT DETECTED Final   Rhinovirus / Enterovirus NOT DETECTED NOT DETECTED Final   Influenza A NOT DETECTED NOT DETECTED Final   Influenza B NOT DETECTED NOT DETECTED Final   Parainfluenza Virus 1 NOT DETECTED NOT DETECTED Final   Parainfluenza Virus 2 NOT DETECTED NOT DETECTED Final   Parainfluenza Virus 3 NOT DETECTED NOT DETECTED Final   Parainfluenza Virus 4 NOT DETECTED NOT DETECTED Final   Respiratory Syncytial Virus NOT DETECTED NOT DETECTED Final   Bordetella pertussis NOT DETECTED NOT DETECTED Final   Chlamydophila pneumoniae NOT DETECTED NOT DETECTED Final   Mycoplasma pneumoniae NOT DETECTED NOT DETECTED Final    Comment: Performed at [facility], [place] [address], [place],  [address]     Invalid input(s): PROCALCITONIN, LACTICACIDVEN   Radiology Studies: No results found.      Scheduled  Meds: . amLODipine  10 mg Oral Daily  . azithromycin (ZITHROMAX) 500 MG IVPB  500 mg Intravenous Q24H  . carvedilol  25  mg Oral BID WC  . cefTRIAXone (ROCEPHIN)  IV  1 g Intravenous Q24H  . insulin aspart  0-15 Units Subcutaneous TID AC & HS  . warfarin  5 mg Oral ONCE-1800  . Warfarin - Pharmacist Dosing Inpatient   Does not apply q1800   Continuous Infusions:    LOS: 3 days    Time spent: 35 minutes    Madex Seals A, MD [facility] Pager [phone]  If 7PM-7AM, please contact night-coverage [email or web address] Password [facility] 07/13/2016, 11:39 AM

## 2016-07-14 ENCOUNTER — Inpatient Hospital Stay (HOSPITAL_COMMUNITY): Payer: Medicare Other

## 2016-07-14 LAB — BASIC METABOLIC PANEL
ANION GAP: 9 (ref 5–15)
BUN: 44 mg/dL — AB (ref 6–20)
CHLORIDE: 110 mmol/L (ref 101–111)
CO2: 21 mmol/L — AB (ref 22–32)
Calcium: 8.8 mg/dL — ABNORMAL LOW (ref 8.9–10.3)
Creatinine, Ser: 3.2 mg/dL — ABNORMAL HIGH (ref 0.61–1.24)
GFR calc Af Amer: 24 mL/min — ABNORMAL LOW (ref >60)
GFR calc non Af Amer: 20 mL/min — ABNORMAL LOW (ref >60)
GLUCOSE: 146 mg/dL — AB (ref 65–99)
POTASSIUM: 3.4 mmol/L — AB (ref 3.5–5.1)
Sodium: 140 mmol/L (ref 135–145)

## 2016-07-14 LAB — MAGNESIUM: Magnesium: 2.1 mg/dL (ref 1.7–2.4)

## 2016-07-14 LAB — CBC
HCT: 35.2 % — ABNORMAL LOW (ref 39.0–52.0)
HEMOGLOBIN: 11.6 g/dL — AB (ref 13.0–17.0)
MCH: 24.8 pg — ABNORMAL LOW (ref 26.0–34.0)
MCHC: 33 g/dL (ref 30.0–36.0)
MCV: 75.2 fL — AB (ref 78.0–100.0)
Platelets: 241 10*3/uL (ref 150–400)
RBC: 4.68 MIL/uL (ref 4.22–5.81)
RDW: 16.7 % — AB (ref 11.5–15.5)
WBC: 11.2 10*3/uL — ABNORMAL HIGH (ref 4.0–10.5)

## 2016-07-14 LAB — GLUCOSE, CAPILLARY
GLUCOSE-CAPILLARY: 171 mg/dL — AB (ref 65–99)
GLUCOSE-CAPILLARY: 134 mg/dL — AB (ref 65–99)
GLUCOSE-CAPILLARY: 148 mg/dL — AB (ref 65–99)
Glucose-Capillary: 132 mg/dL — ABNORMAL HIGH (ref 65–99)

## 2016-07-14 LAB — PROTIME-INR
INR: 2.09
Prothrombin Time: 23.8 seconds — ABNORMAL HIGH (ref 11.4–15.2)

## 2016-07-14 MED ORDER — WARFARIN SODIUM 2.5 MG PO TABS
7.5000 mg | ORAL_TABLET | Freq: Once | ORAL | Status: AC
  Administered 2016-07-14: 7.5 mg via ORAL
  Filled 2016-07-14: qty 1

## 2016-07-14 MED ORDER — ISOSORBIDE MONONITRATE ER 30 MG PO TB24
30.0000 mg | ORAL_TABLET | Freq: Every day | ORAL | Status: DC
  Administered 2016-07-14 – 2016-07-15 (×2): 30 mg via ORAL
  Filled 2016-07-14 (×2): qty 1

## 2016-07-14 MED ORDER — IPRATROPIUM-ALBUTEROL 0.5-2.5 (3) MG/3ML IN SOLN
3.0000 mL | Freq: Three times a day (TID) | RESPIRATORY_TRACT | Status: DC
  Administered 2016-07-15: 3 mL via RESPIRATORY_TRACT
  Filled 2016-07-14: qty 3

## 2016-07-14 MED ORDER — POTASSIUM CHLORIDE CRYS ER 20 MEQ PO TBCR
30.0000 meq | EXTENDED_RELEASE_TABLET | Freq: Two times a day (BID) | ORAL | Status: DC
  Administered 2016-07-14 – 2016-07-15 (×3): 30 meq via ORAL
  Filled 2016-07-14 (×3): qty 1

## 2016-07-14 MED ORDER — FUROSEMIDE 10 MG/ML IJ SOLN
60.0000 mg | Freq: Two times a day (BID) | INTRAMUSCULAR | Status: DC
  Administered 2016-07-14 – 2016-07-15 (×3): 60 mg via INTRAVENOUS
  Filled 2016-07-14 (×3): qty 6

## 2016-07-14 MED ORDER — HYDRALAZINE HCL 25 MG PO TABS
25.0000 mg | ORAL_TABLET | Freq: Three times a day (TID) | ORAL | Status: DC
  Administered 2016-07-14 – 2016-07-15 (×3): 25 mg via ORAL
  Filled 2016-07-14 (×3): qty 1

## 2016-07-14 NOTE — Progress Notes (Addendum)
PROGRESS NOTE  Joshua Fernandez  LNL:[id number] DOB: 09/29/1960 DOA: 07/10/2016 PCP: Pcp Not In System Outpatient Specialists:  Subjective: Continues to complain about SOB, still 4 L of oxygen. CXR repeated this morning has mild congestion and small bilateral effusion, started on scheduled IV Lasix.  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  Presents to ER at [facility] w/ 2d h/o what was initially described as URI symptoms, subjective fever, general malaise, head congestion, sore throat & cough. Symptoms worsened, coughing worse. On arrival to ER was temp 101.6, noted to be hypoxic w/ increased work of breathing which ultimately required NIPPV. CXR showed RLL infiltrate. Was to be admitted to SDU. While awaiting bed SBP dropped to 80s. Received 700 ml bolus and SBP improved to 136. Because of this 53ml/kg bolus was held off. He did receive abx in the ER. Because of his hypotension it was decided that he should be admitted to the intensive care for close observation.   Assessment & Plan:   Principal Problem:   Sepsis ([place]) Active Problems:   Community acquired pneumonia   Acute respiratory failure with hypoxia ([place])   Hypokalemia   Hypomagnesemia   CKD (chronic kidney disease), stage III   Sepsis -Patient presented with fever of 101.6, WBC is 20.9 and presence of pneumonia. -Received aggressive hydration with IV fluids. -Lactic acid was 2.6 suggestive of hypoperfusion and end organ damage, pro-calcitonin >150 -Started on Rocephin and azithromycin for community-acquired pneumonia. -Sepsis physiology resolved.  Acute respiratory failure with hypoxia -Required 4 L of oxygen to keep oxygen saturation above 90%. -This is secondary to community-acquired pneumonia, this is improving,  try to wean to room air. -Continue diuresis, continue trials to wean to room air, chances are he might need oxygen on discharge.  Community acquired pneumonia -Started on Rocephin and Zithromax, overall he is improving. -Continue supportive management with bronchodilators, mucolytics, antitussives and oxygen as needed.  Hypokalemia and hypomagnesemia -Potassium was 2.8 and magnesium was 1.0, both repleted with oral and parenteral supplements.  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 -Creatinine peak was 4.1, this is improving to 3.6. Continue to hold nephrotoxic medications including lisinopril. -Check BMP in a.m.  Acute on chronic combined systolic and diastolic CHF -Unspecified type, likely combined systolic and diastolic, check 2-D echo -IV Lasix started, daily weight, intake/output. -Follow clinically, try to wean off of oxygen.  History of ventricular mural thrombosis -This is per records from [facility], continue Coumadin   DVT prophylaxis:  Code Status: Full Code Family Communication:  Disposition Plan:  Diet: Diet Heart Room service appropriate? Yes; Fluid consistency: Thin  Consultants:   None, Was under PCCM  Procedures:   None  Antimicrobials:   Rocephin and Zithromax  Objective: Vitals:   07/13/16 2300 07/14/16 0123 07/14/16 0509 07/14/16 0722  BP:   (!) 173/86   Pulse:   66 69  Resp:   20 20  Temp:   97.9 F (36.6 C)   TempSrc:   Oral   SpO2: 96% 94% 93% 95%  Weight:   135.6 kg (298 lb 15.1 oz)   Height:        Intake/Output Summary (Last 24 hours) at 07/14/16 1117 Last data filed  at 07/14/16 0549  Gross per 24 hour  Intake              410 ml  Output                0 ml  Net              410 ml   Filed Weights   07/12/16 0500 07/13/16 0500 07/14/16 0509  Weight: (!) 137 kg (302 lb) 135.6 kg (298 lb 14.4 oz) 135.6 kg (298 lb 15.1 oz)    Examination: General exam: Appears calm and  comfortable  Respiratory system: Clear to auscultation. Respiratory effort normal. Cardiovascular system: S1 & S2 heard, RRR. No JVD, murmurs, rubs, gallops or clicks. No pedal edema. Gastrointestinal system: Abdomen is nondistended, soft and nontender. No organomegaly or masses felt. Normal bowel sounds heard. Central nervous system: Alert and oriented. No focal neurological deficits. Extremities: Symmetric 5 x 5 power. Skin: No rashes, lesions or ulcers Psychiatry: Judgement and insight appear normal. Mood & affect appropriate.   Data Reviewed: I have personally reviewed following labs and imaging studies  CBC:  Recent Labs Lab 07/10/16 0330 07/11/16 0311 07/11/16 1352 07/12/16 0311 07/14/16 0447  WBC 20.9* 25.0* 21.3* 21.4* 11.2*  NEUTROABS 17.1*  --   --   --   --   HGB 12.6* 10.7* 11.6* 11.0* 11.6*  HCT 37.4* 32.8* 35.2* 33.4* 35.2*  MCV 74.2* 75.1* 75.2* 75.2* 75.2*  PLT 236 190 175 187 408   Basic Metabolic Panel:  Recent Labs Lab 07/10/16 0330 07/10/16 0900 07/11/16 0311 07/12/16 0311 07/14/16 0447  NA 142 139 140 142 140  K 2.8* 3.2* 3.3* 3.6 3.4*  CL 114* 109 113* 114* 110  CO2 19* 18* 19* 20* 21*  GLUCOSE 142* 183* 115* 117* 146*  BUN 37* 41* 44* 41* 44*  CREATININE 3.48* 4.10* 3.96* 3.60* 3.20*  CALCIUM 8.2* 7.5* 7.5* 8.2* 8.8*  MG 1.0*  --  1.0*  --  2.1  PHOS  --   --  2.6  --   --    GFR: Estimated Creatinine Clearance: 36.7 mL/min (A) (by C-G formula based on SCr of 3.2 mg/dL (H)). Liver Function Tests:  Recent Labs Lab 07/10/16 0900 07/12/16 0311  AST 21 32  ALT 22 33  ALKPHOS 58 64  BILITOT 1.0 0.7  PROT 6.5 6.5  ALBUMIN 2.9* 2.6*   No results for input(s): LIPASE, AMYLASE in the last 168 hours. No results for input(s): AMMONIA in the last 168 hours. Coagulation Profile:  Recent Labs Lab 07/10/16 0330 07/11/16 0311 07/12/16 0311 07/13/16 0508 07/14/16 0447  INR 2.66 3.59 3.64 2.57 2.09   Cardiac Enzymes:  Recent Labs Lab  07/10/16 0330 07/10/16 1229 07/10/16 1857 07/11/16 0019  TROPONINI <0.03 0.03* 0.03* 0.04*   BNP (last 3 results) No results for input(s): PROBNP in the last 8760 hours. HbA1C: No results for input(s): HGBA1C in the last 72 hours. CBG:  Recent Labs Lab 07/13/16 0726 07/13/16 1122 07/13/16 1637 07/13/16 2125 07/14/16 0723  GLUCAP 147* 152* 152* 152* 171*   Lipid Profile: No results for input(s): CHOL, HDL, LDLCALC, TRIG, CHOLHDL, LDLDIRECT in the last 72 hours. Thyroid Function Tests: No results for input(s): TSH, T4TOTAL, FREET4, T3FREE, THYROIDAB in the last 72 hours. Anemia Panel: No results for input(s): VITAMINB12, FOLATE, FERRITIN, TIBC, IRON, RETICCTPCT in the last 72 hours. Urine analysis:    Component Value Date/Time   COLORURINE YELLOW 07/11/2016 0001   APPEARANCEUR  CLOUDY (A) 07/11/2016 0001   LABSPEC 1.011 07/11/2016 0001   PHURINE 5.0 07/11/2016 0001   GLUCOSEU 50 (A) 07/11/2016 0001   HGBUR MODERATE (A) 07/11/2016 0001   BILIRUBINUR NEGATIVE 07/11/2016 0001   KETONESUR NEGATIVE 07/11/2016 0001   PROTEINUR >=300 (A) 07/11/2016 0001   NITRITE NEGATIVE 07/11/2016 0001   LEUKOCYTESUR NEGATIVE 07/11/2016 0001   Sepsis Labs: @LABRCNTIP (procalcitonin:4,lacticidven:4)  ) Recent Results (from the past 240 hour(s))  Blood culture (routine x 2)     Status: None (Preliminary result)   Collection Time: 07/10/16  5:05 AM  Result Value Ref Range Status   Specimen Description BLOOD RIGHT ARM  Final   Special Requests   Final    BOTTLES DRAWN AEROBIC AND ANAEROBIC Blood Culture adequate volume   Culture   Final    NO GROWTH 3 DAYS Performed at [facility], [place] [address], [place], [place] [address]    Report Status PENDING  Incomplete  Blood Culture (routine x 2)     Status: None (Preliminary result)   Collection Time: 07/10/16  8:20 AM  Result Value Ref Range Status   Specimen Description BLOOD RIGHT ASSIST CONTROL  Final   Special Requests   Final      BOTTLES DRAWN AEROBIC AND ANAEROBIC Blood Culture adequate volume   Culture   Final    NO GROWTH 3 DAYS Performed at [facility], [place] [address] [place], [place] [address]    Report Status PENDING  Incomplete  C difficile quick scan w PCR reflex     Status: None   Collection Time: 07/10/16  8:56 AM  Result Value Ref Range Status   C Diff antigen NEGATIVE NEGATIVE Final   C Diff toxin NEGATIVE NEGATIVE Final   C Diff interpretation No C. difficile detected.  Final    Comment: Performed at [facility], [place] [address] [place], [place] [address]  MRSA PCR Screening     Status: None   Collection Time: 07/10/16 12:09 PM  Result Value Ref Range Status   MRSA by PCR NEGATIVE NEGATIVE Final    Comment:        The GeneXpert MRSA Assay (FDA approved for NASAL specimens only), is one component of a comprehensive MRSA colonization surveillance program. It is not intended to diagnose MRSA infection nor to guide or monitor treatment for MRSA infections.   Respiratory Panel by PCR     Status: None   Collection Time: 07/10/16 12:09 PM  Result Value Ref Range Status   Adenovirus NOT DETECTED NOT DETECTED Final   Coronavirus 229E NOT DETECTED NOT DETECTED Final   Coronavirus HKU1 NOT DETECTED NOT DETECTED Final   Coronavirus NL63 NOT DETECTED NOT DETECTED Final   Coronavirus OC43 NOT DETECTED NOT DETECTED Final   Metapneumovirus NOT DETECTED NOT DETECTED Final   Rhinovirus / Enterovirus NOT DETECTED NOT DETECTED Final   Influenza A NOT DETECTED NOT DETECTED Final   Influenza B NOT DETECTED NOT DETECTED Final   Parainfluenza Virus 1 NOT DETECTED NOT DETECTED Final   Parainfluenza Virus 2 NOT DETECTED NOT DETECTED Final   Parainfluenza Virus 3 NOT DETECTED NOT DETECTED Final   Parainfluenza Virus 4 NOT DETECTED NOT DETECTED Final   Respiratory Syncytial Virus NOT DETECTED NOT DETECTED Final   Bordetella pertussis NOT DETECTED NOT DETECTED Final   Chlamydophila pneumoniae  NOT DETECTED NOT DETECTED Final   Mycoplasma pneumoniae NOT DETECTED NOT DETECTED Final    Comment: Performed at [facility], [place] [address], [place], [place] [address]     Invalid input(s): PROCALCITONIN, LACTICACIDVEN   Radiology Studies: No results found.      Scheduled Meds: . amLODipine  10 mg Oral Daily  . azithromycin (ZITHROMAX) 500 MG IVPB  500 mg Intravenous Q24H  . carvedilol  25 mg Oral BID WC  . cefTRIAXone (ROCEPHIN)  IV  1 g Intravenous Q24H  . furosemide  60 mg Intravenous BID  . guaiFENesin  1,200 mg Oral BID  . insulin aspart  0-15 Units Subcutaneous TID AC & HS  . ipratropium-albuterol  3 mL Nebulization Q6H  . potassium chloride  30 mEq Oral BID  . warfarin  7.5 mg Oral ONCE-1800  . Warfarin - Pharmacist Dosing Inpatient   Does not apply q1800   Continuous Infusions:    LOS: 4 days    Time spent: 35 minutes    Judea Riches A, MD [facility] Pager [phone]  If 7PM-7AM, please contact night-coverage [email or web address] Password [facility] 07/14/2016, 11:17 AM

## 2016-07-14 NOTE — Progress Notes (Signed)
ANTICOAGULATION CONSULT NOTE - Follow Up Consult  Pharmacy Consult for warfarin Indication: h/o LV thrombus  No Known Allergies  Patient Measurements: Height: 5\' 11"  (180.3 cm) Weight: 298 lb 15.1 oz (135.6 kg) IBW/kg (Calculated) : 75.3  Vital Signs: Temp: 97.9 F (36.6 C) (04/15 0509) Temp Source: Oral (04/15 0509) BP: 173/86 (04/15 0509) Pulse Rate: 69 (04/15 0722)  Labs:  Recent Labs  07/11/16 1352 07/12/16 0311 07/13/16 0508 07/14/16 0447  HGB 11.6* 11.0*  --  11.6*  HCT 35.2* 33.4*  --  35.2*  PLT 175 187  --  241  LABPROT  --  37.1* 28.1* 23.8*  INR  --  3.64 2.57 2.09  CREATININE  --  3.60*  --  3.20*    Estimated Creatinine Clearance: 36.7 mL/min (A) (by C-G formula based on SCr of 3.2 mg/dL (H)).  Assessment: 27 yoM admitted with acute resp failure, septic shock 2/2 CAP, AoCKD on chronic warfarin PTA for h/o LV thrombus managed by anticoagulation clinic.  Per recent clinic notes, patient is supposed to be on 10 mg daily.  Admission INR therapeutic at 2.66.  Pharmacy consulted to continue management of warfarin while admitted.  Today, 07/14/2016:  INR is therapeutic at 2.09. (dose held on 4/12 and 4/12 d/t supratherapeutic INR-- pt was likely more sensitive to warfarin d/t NPO status during that time period)  CBC stable   No bleeding issues documented.  Drug interactions with warfarin: azithromycin.  Cardiac diet (~50 meal intake documented)  Goal of Therapy:  INR 2-3  Plan:   warfarin 7.5 mg PO x1  Daily INR  Dia Sitter, PharmD, BCPS 07/14/2016 9:03 AM

## 2016-07-15 ENCOUNTER — Inpatient Hospital Stay (HOSPITAL_COMMUNITY): Payer: Medicare Other

## 2016-07-15 DIAGNOSIS — R0603 Acute respiratory distress: Secondary | ICD-10-CM

## 2016-07-15 LAB — CBC
HEMATOCRIT: 31.8 % — AB (ref 39.0–52.0)
HEMOGLOBIN: 10.1 g/dL — AB (ref 13.0–17.0)
MCH: 23.3 pg — ABNORMAL LOW (ref 26.0–34.0)
MCHC: 31.8 g/dL (ref 30.0–36.0)
MCV: 73.4 fL — AB (ref 78.0–100.0)
Platelets: 250 10*3/uL (ref 150–400)
RBC: 4.33 MIL/uL (ref 4.22–5.81)
RDW: 16.3 % — AB (ref 11.5–15.5)
WBC: 9.3 10*3/uL (ref 4.0–10.5)

## 2016-07-15 LAB — CULTURE, BLOOD (ROUTINE X 2)
CULTURE: NO GROWTH
Culture: NO GROWTH
Special Requests: ADEQUATE
Special Requests: ADEQUATE

## 2016-07-15 LAB — BASIC METABOLIC PANEL
ANION GAP: 9 (ref 5–15)
BUN: 43 mg/dL — AB (ref 6–20)
CHLORIDE: 109 mmol/L (ref 101–111)
CO2: 23 mmol/L (ref 22–32)
Calcium: 8.6 mg/dL — ABNORMAL LOW (ref 8.9–10.3)
Creatinine, Ser: 3.25 mg/dL — ABNORMAL HIGH (ref 0.61–1.24)
GFR calc Af Amer: 23 mL/min — ABNORMAL LOW (ref >60)
GFR calc non Af Amer: 20 mL/min — ABNORMAL LOW (ref >60)
Glucose, Bld: 127 mg/dL — ABNORMAL HIGH (ref 65–99)
POTASSIUM: 3.5 mmol/L (ref 3.5–5.1)
SODIUM: 141 mmol/L (ref 135–145)

## 2016-07-15 LAB — GLUCOSE, CAPILLARY
GLUCOSE-CAPILLARY: 108 mg/dL — AB (ref 65–99)
Glucose-Capillary: 146 mg/dL — ABNORMAL HIGH (ref 65–99)

## 2016-07-15 LAB — PROTIME-INR
INR: 1.89
Prothrombin Time: 21.9 seconds — ABNORMAL HIGH (ref 11.4–15.2)

## 2016-07-15 MED ORDER — HYDRALAZINE HCL 50 MG PO TABS
50.0000 mg | ORAL_TABLET | Freq: Three times a day (TID) | ORAL | 0 refills | Status: DC
Start: 2016-07-15 — End: 2017-02-03

## 2016-07-15 MED ORDER — CEFUROXIME AXETIL 500 MG PO TABS: 500.0000 mg | ORAL_TABLET | Freq: Two times a day (BID) | ORAL | 0 refills | Status: DC

## 2016-07-15 MED ORDER — ISOSORBIDE MONONITRATE ER 30 MG PO TB24: 30.0000 mg | ORAL_TABLET | Freq: Every day | ORAL | 0 refills | Status: DC

## 2016-07-15 MED ORDER — TORSEMIDE 20 MG PO TABS: 20.0000 mg | ORAL_TABLET | Freq: Two times a day (BID) | ORAL | 0 refills | Status: DC

## 2016-07-15 MED ORDER — AMLODIPINE BESYLATE 5 MG PO TABS: 5.0000 mg | ORAL_TABLET | ORAL | 0 refills | Status: DC

## 2016-07-15 NOTE — Progress Notes (Signed)
Discharge instructions given and explained to patient/wife, they verbalized understanding, denies any pain/distress. Patient waiting on oxygen for discharge.

## 2016-07-15 NOTE — Progress Notes (Signed)
SATURATION QUALIFICATIONS: (This note is used to comply with regulatory documentation for home oxygen)  Patient Saturations on Room Air at Rest = 92%  Patient Saturations on Room Air while Ambulating = 84%  Patient Saturations on 3 Liters of oxygen while Ambulating = 87%  Please briefly explain why patient needs home oxygen: Patient short of breath with activity and oxygen drops to the 80s

## 2016-07-15 NOTE — Discharge Summary (Signed)
Physician Discharge Summary  Joshua Fernandez VXB:[id number] DOB: 11-28-1960 DOA: 07/10/2016  PCP: Pcp Not In System  Admit date: 07/10/2016 Discharge date: 07/15/2016  Admitted From: Home Disposition: Home  Recommendations for Outpatient Follow-up:  1. Follow up with PCP in 1-2 weeks 2. Please obtain BMP/CBC in one week  Home Health: NA Equipment/Devices:NA  Discharge Condition: Stable CODE STATUS: Full Code Diet recommendation: Diet Heart Room service appropriate? 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  Presents to ER at [facility] w/ 2d h/o what was initially described as URI symptoms, subjective fever, general malaise, head congestion, sore throat &cough. Symptoms worsened, coughing worse. On arrival to ER was temp 101.6, noted to be hypoxic w/ increased work of breathing which ultimately required NIPPV. CXR showed RLL infiltrate. Was to be admitted to SDU. While awaiting bed SBP dropped to 80s. Received 700 ml bolus and SBP improved to 136. Because of this 70ml/kg bolus was held off. He did receive abx in the ER. Because of his hypotension it was decided that he should be admitted to the intensive care for close observation.   Discharge Diagnoses:  Principal Problem:   Sepsis ([place]) Active Problems:   Community acquired pneumonia   Acute respiratory failure with hypoxia ([place])   Hypokalemia   Hypomagnesemia   CKD (chronic kidney disease), stage III   Sepsis -Patient presented with fever of 101.6, WBC is 20.9 and presence of pneumonia. -Received aggressive hydration with IV fluids. -Lactic acid was 2.6 suggestive of hypoperfusion and end organ damage, pro-calcitonin >150 -Started on Rocephin and  azithromycin for community-acquired pneumonia. -Sepsis physiology resolved.  Acute respiratory failure with hypoxia -Required 4 L of oxygen to keep oxygen saturation above 90%. -This is secondary to community-acquired pneumonia and mild fluid overload from his CKD. -Started on aggressive diuresis. He still needed oxygen on discharge. -Discharged on DME oxygen to follow-up with PCP for further follow-up.  Community acquired pneumonia -Started on Rocephin and Zithromax while he was in the hospital. -Treated with supportive management with bronchodilators, mucolytics, antitussives and oxygen as needed. -Discharged on Ceftin for 5 more days.  Hypokalemia and hypomagnesemia -Potassium was 2.8 and magnesium was 1.0, both repleted with oral and parenteral supplements.  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 -Creatinine peak was 4.1, this is improving to 3.6. Continue to hold nephrotoxic medications including lisinopril. -Discharged on torsemide 20 mg twice a day, follow-up with nephrology as outpatient.  Acute on chronic combined systolic and diastolic CHF -Unspecified type, likely combined systolic and diastolic, follow-up with PCP. -Diuresed with IV Lasix, his weight and intake/output was monitored daily. -Discharged on Coreg, lisinopril discontinued because of worsening renal function. -Started hydralazine 50 mg 3 times a day and Imdur 30 mg, amlodipine decreased to 5 mg. -Also discharged on torsemide 20 mg twice a day  History of ventricular mural thrombosis -This is per records from [facility], continue Coumadin, INR was 1.9 on discharge   Discharge Instructions  Discharge Instructions    Diet - low sodium heart healthy    Complete by:  As directed    Increase activity slowly    Complete by:  As directed  Allergies as of 07/15/2016   No Known Allergies     Medication List    STOP taking these medications    lisinopril 40 MG tablet Commonly known as:  PRINIVIL,ZESTRIL     TAKE these medications   amLODipine 5 MG tablet Commonly known as:  NORVASC Take 1 tablet (5 mg total) by mouth every morning. What changed:  medication strength  how much to take   atorvastatin 40 MG tablet Commonly known as:  LIPITOR Take 40 mg by mouth every morning.   carvedilol 25 MG tablet Commonly known as:  COREG Take 25 mg by mouth 2 (two) times daily with a meal.   cefUROXime 500 MG tablet Commonly known as:  CEFTIN Take 1 tablet (500 mg total) by mouth 2 (two) times daily with a meal.   cholecalciferol 400 units Tabs tablet Commonly known as:  VITAMIN D Take 4,000 Units by mouth every morning.   digoxin 0.25 MG tablet Commonly known as:  LANOXIN Take 125 mg by mouth every morning.   famotidine 10 MG tablet Commonly known as:  PEPCID Take 10 mg by mouth every morning.   glipiZIDE 10 MG 24 hr tablet Commonly known as:  GLUCOTROL XL Take 10 mg by mouth daily with breakfast.   guaiFENesin 600 MG 12 hr tablet Commonly known as:  MUCINEX Take 600 mg by mouth every morning.   hydrALAZINE 50 MG tablet Commonly known as:  APRESOLINE Take 1 tablet (50 mg total) by mouth 3 (three) times daily.   isosorbide mononitrate 30 MG 24 hr tablet Commonly known as:  IMDUR Take 1 tablet (30 mg total) by mouth daily.   latanoprost 0.005 % ophthalmic solution Commonly known as:  XALATAN Place 1 drop into both eyes at bedtime as needed (irritation).   potassium chloride SA 20 MEQ tablet Commonly known as:  K-DUR,KLOR-CON Take 20 mEq by mouth every morning.   torsemide 20 MG tablet Commonly known as:  DEMADEX Take 1 tablet (20 mg total) by mouth 2 (two) times daily.   warfarin 10 MG tablet Commonly known as:  COUMADIN Take 5-10 mg by mouth See admin instructions. Take 10 mg by mouth daily except Saturday take 5 mg            Durable Medical Equipment        Start     Ordered   07/15/16  1017  For home use only DME oxygen  Once    Question Answer Comment  Mode or (Route) Nasal cannula   Liters per Minute 2   Oxygen delivery system Gas      07/15/16 1016      No Known Allergies  Consultations:  None  Procedures (Echo, Carotid, EGD, Colonoscopy, ERCP)   Radiological studies: Dg Chest 2 View  Result Date: 07/14/2016 CLINICAL DATA:  Shortness breath and cough today. EXAM: CHEST  2 VIEW COMPARISON:  One-view chest x-ray 07/10/2016 FINDINGS: Heart is enlarged. Bilateral lower lobe airspace disease is present. Small bilateral effusions are noted. The upper lung fields are clear. Mild pulmonary vascular congestion is present. IMPRESSION: 1. Bilateral lower lobe pneumonia. 2. Cardiomegaly and mild pulmonary vascular congestion. 3. Small pleural effusions. Electronically Signed   By: San Morelle M.D.   On: 07/14/2016 11:34   Dg Chest Port 1 View  Result Date: 07/10/2016 CLINICAL DATA:  Shortness of Breath on BiPAP EXAM: PORTABLE CHEST 1 VIEW COMPARISON:  07/10/2016 FINDINGS: Cardiomegaly again noted. Persistent airspace opacity in right infrahilar / base medially  suspicious for pneumonia. No pulmonary edema. IMPRESSION: Cardiomegaly again noted. Persistent airspace opacity in right infrahilar / base medially suspicious for pneumonia. Electronically Signed   By: Lahoma Crocker M.D.   On: 07/10/2016 08:40   Dg Chest Port 1 View  Result Date: 07/10/2016 CLINICAL DATA:  Acute onset of severe shortness of breath. Initial encounter. EXAM: PORTABLE CHEST 1 VIEW COMPARISON:  None. FINDINGS: The lungs are hypoexpanded. Vascular congestion is noted. Right basilar airspace opacity raises concern for pneumonia. There is no evidence of pleural effusion or pneumothorax. The cardiomediastinal silhouette is enlarged. No acute osseous abnormalities are seen. IMPRESSION: 1. Lungs hypoexpanded. Right basilar airspace opacity raises concern for pneumonia. 2. Vascular congestion and  cardiomegaly. Electronically Signed   By: Garald Balding M.D.   On: 07/10/2016 04:02    Subjective:  Discharge Exam: Vitals:   07/14/16 2109 07/15/16 0521 07/15/16 0923 07/15/16 1004  BP: 128/64 (!) 146/79  (!) 153/74  Pulse: 65 60    Resp: 20 20    Temp: 98.4 F (36.9 C) 97.6 F (36.4 C)    TempSrc: Oral Oral    SpO2: 98% 92% 93%   Weight:    134.5 kg (296 lb 8 oz)  Height:       General: Pt is alert, awake, not in acute distress Cardiovascular: RRR, S1/S2 +, no rubs, no gallops Respiratory: CTA bilaterally, no wheezing, no rhonchi Abdominal: Soft, NT, ND, bowel sounds + Extremities: no edema, no cyanosis   The results of significant diagnostics from this hospitalization (including imaging, microbiology, ancillary and laboratory) are listed below for reference.    Microbiology: Recent Results (from the past 240 hour(s))  Blood culture (routine x 2)     Status: None (Preliminary result)   Collection Time: 07/10/16  5:05 AM  Result Value Ref Range Status   Specimen Description BLOOD RIGHT ARM  Final   Special Requests   Final    BOTTLES DRAWN AEROBIC AND ANAEROBIC Blood Culture adequate volume   Culture   Final    NO GROWTH 4 DAYS Performed at [facility], [address], [place], [place] [address]    Report Status PENDING  Incomplete  Blood Culture (routine x 2)     Status: None (Preliminary result)   Collection Time: 07/10/16  8:20 AM  Result Value Ref Range Status   Specimen Description BLOOD RIGHT ASSIST CONTROL  Final   Special Requests   Final    BOTTLES DRAWN AEROBIC AND ANAEROBIC Blood Culture adequate volume   Culture   Final    NO GROWTH 4 DAYS Performed at [facility], [place] [address], [place], [place] [address]    Report Status PENDING  Incomplete  C difficile quick scan w PCR reflex     Status: None   Collection Time: 07/10/16  8:56 AM  Result Value Ref Range Status   C Diff antigen NEGATIVE NEGATIVE Final   C Diff toxin NEGATIVE  NEGATIVE Final   C Diff interpretation No C. difficile detected.  Final    Comment: Performed at [facility], [place] [address], [place],  [address]  MRSA PCR Screening     Status: None   Collection Time: 07/10/16 12:09 PM  Result Value Ref Range Status   MRSA by PCR NEGATIVE NEGATIVE Final    Comment:        The GeneXpert MRSA Assay (FDA approved for NASAL specimens only), is one component of a comprehensive MRSA colonization surveillance  program. It is not intended to diagnose MRSA infection nor to guide or monitor treatment for MRSA infections.   Respiratory Panel by PCR     Status: None   Collection Time: 07/10/16 12:09 PM  Result Value Ref Range Status   Adenovirus NOT DETECTED NOT DETECTED Final   Coronavirus 229E NOT DETECTED NOT DETECTED Final   Coronavirus HKU1 NOT DETECTED NOT DETECTED Final   Coronavirus NL63 NOT DETECTED NOT DETECTED Final   Coronavirus OC43 NOT DETECTED NOT DETECTED Final   Metapneumovirus NOT DETECTED NOT DETECTED Final   Rhinovirus / Enterovirus NOT DETECTED NOT DETECTED Final   Influenza A NOT DETECTED NOT DETECTED Final   Influenza B NOT DETECTED NOT DETECTED Final   Parainfluenza Virus 1 NOT DETECTED NOT DETECTED Final   Parainfluenza Virus 2 NOT DETECTED NOT DETECTED Final   Parainfluenza Virus 3 NOT DETECTED NOT DETECTED Final   Parainfluenza Virus 4 NOT DETECTED NOT DETECTED Final   Respiratory Syncytial Virus NOT DETECTED NOT DETECTED Final   Bordetella pertussis NOT DETECTED NOT DETECTED Final   Chlamydophila pneumoniae NOT DETECTED NOT DETECTED Final   Mycoplasma pneumoniae NOT DETECTED NOT DETECTED Final    Comment: Performed at [facility], [place] [address], [place], [place] [address]     Labs: BNP (last 3 results)  Recent Labs  07/10/16 0330  BNP 151.7*   Basic Metabolic Panel:  Recent Labs Lab 07/10/16 0330 07/10/16 0900 07/11/16 0311 07/12/16 0311 07/14/16 0447 07/15/16 0437  NA 142 139 140 142  140 141  K 2.8* 3.2* 3.3* 3.6 3.4* 3.5  CL 114* 109 113* 114* 110 109  CO2 19* 18* 19* 20* 21* 23  GLUCOSE 142* 183* 115* 117* 146* 127*  BUN 37* 41* 44* 41* 44* 43*  CREATININE 3.48* 4.10* 3.96* 3.60* 3.20* 3.25*  CALCIUM 8.2* 7.5* 7.5* 8.2* 8.8* 8.6*  MG 1.0*  --  1.0*  --  2.1  --   PHOS  --   --  2.6  --   --   --    Liver Function Tests:  Recent Labs Lab 07/10/16 0900 07/12/16 0311  AST 21 32  ALT 22 33  ALKPHOS 58 64  BILITOT 1.0 0.7  PROT 6.5 6.5  ALBUMIN 2.9* 2.6*   No results for input(s): LIPASE, AMYLASE in the last 168 hours. No results for input(s): AMMONIA in the last 168 hours. CBC:  Recent Labs Lab 07/10/16 0330 07/11/16 0311 07/11/16 1352 07/12/16 0311 07/14/16 0447 07/15/16 0437  WBC 20.9* 25.0* 21.3* 21.4* 11.2* 9.3  NEUTROABS 17.1*  --   --   --   --   --   HGB 12.6* 10.7* 11.6* 11.0* 11.6* 10.1*  HCT 37.4* 32.8* 35.2* 33.4* 35.2* 31.8*  MCV 74.2* 75.1* 75.2* 75.2* 75.2* 73.4*  PLT 236 190 175 187 241 250   Cardiac Enzymes:  Recent Labs Lab 07/10/16 0330 07/10/16 1229 07/10/16 1857 07/11/16 0019  TROPONINI <0.03 0.03* 0.03* 0.04*   BNP: Invalid input(s): POCBNP CBG:  Recent Labs Lab 07/14/16 0723 07/14/16 1144 07/14/16 1632 07/14/16 2113 07/15/16 0822  GLUCAP 171* 132* 134* 148* 146*   D-Dimer No results for input(s): DDIMER in the last 72 hours. Hgb A1c No results for input(s): HGBA1C in the last 72 hours. Lipid Profile No results for input(s): CHOL, HDL, LDLCALC, TRIG, CHOLHDL, LDLDIRECT in the last 72 hours. Thyroid function studies No results for input(s): TSH, T4TOTAL, T3FREE, THYROIDAB in the last 72 hours.  Invalid input(s): FREET3  Anemia work up No results for input(s): VITAMINB12, FOLATE, FERRITIN, TIBC, IRON, RETICCTPCT in the last 72 hours. Urinalysis    Component Value Date/Time   COLORURINE YELLOW 07/11/2016 0001   APPEARANCEUR CLOUDY (A) 07/11/2016 0001   LABSPEC 1.011 07/11/2016 0001   PHURINE 5.0  07/11/2016 0001   GLUCOSEU 50 (A) 07/11/2016 0001   HGBUR MODERATE (A) 07/11/2016 0001   BILIRUBINUR NEGATIVE 07/11/2016 0001   KETONESUR NEGATIVE 07/11/2016 0001   PROTEINUR >=300 (A) 07/11/2016 0001   NITRITE NEGATIVE 07/11/2016 0001   LEUKOCYTESUR NEGATIVE 07/11/2016 0001   Sepsis Labs Invalid input(s): PROCALCITONIN,  WBC,  LACTICIDVEN Microbiology Recent Results (from the past 240 hour(s))  Blood culture (routine x 2)     Status: None (Preliminary result)   Collection Time: 07/10/16  5:05 AM  Result Value Ref Range Status   Specimen Description BLOOD RIGHT ARM  Final   Special Requests   Final    BOTTLES DRAWN AEROBIC AND ANAEROBIC Blood Culture adequate volume   Culture   Final    NO GROWTH 4 DAYS Performed at [facility], [place] [address], [place], [place] [address]    Report Status PENDING  Incomplete  Blood Culture (routine x 2)     Status: None (Preliminary result)   Collection Time: 07/10/16  8:20 AM  Result Value Ref Range Status   Specimen Description BLOOD RIGHT ASSIST CONTROL  Final   Special Requests   Final    BOTTLES DRAWN AEROBIC AND ANAEROBIC Blood Culture adequate volume   Culture   Final    NO GROWTH 4 DAYS Performed at [facility], [place] [address], [place], [place] [address]    Report Status PENDING  Incomplete  C difficile quick scan w PCR reflex     Status: None   Collection Time: 07/10/16  8:56 AM  Result Value Ref Range Status   C Diff antigen NEGATIVE NEGATIVE Final   C Diff toxin NEGATIVE NEGATIVE Final   C Diff interpretation No C. difficile detected.  Final    Comment: Performed at [facility], [place] [address], [place], [place] [address]  MRSA PCR Screening     Status: None   Collection Time: 07/10/16 12:09 PM  Result Value Ref Range Status   MRSA by PCR NEGATIVE NEGATIVE Final    Comment:        The GeneXpert MRSA Assay (FDA approved for NASAL specimens only), is one component of a comprehensive MRSA  colonization surveillance program. It is not intended to diagnose MRSA infection nor to guide or monitor treatment for MRSA infections.   Respiratory Panel by PCR     Status: None   Collection Time: 07/10/16 12:09 PM  Result Value Ref Range Status   Adenovirus NOT DETECTED NOT DETECTED Final   Coronavirus 229E NOT DETECTED NOT DETECTED Final   Coronavirus HKU1 NOT DETECTED NOT DETECTED Final   Coronavirus NL63 NOT DETECTED NOT DETECTED Final   Coronavirus OC43 NOT DETECTED NOT DETECTED Final   Metapneumovirus NOT DETECTED NOT DETECTED Final   Rhinovirus / Enterovirus NOT DETECTED NOT DETECTED Final   Influenza A NOT DETECTED NOT DETECTED Final   Influenza B NOT DETECTED NOT DETECTED Final   Parainfluenza Virus 1 NOT DETECTED NOT DETECTED Final   Parainfluenza Virus 2 NOT DETECTED NOT DETECTED Final   Parainfluenza Virus 3 NOT DETECTED NOT DETECTED Final   Parainfluenza Virus 4 NOT DETECTED NOT DETECTED Final   Respiratory Syncytial Virus NOT DETECTED  NOT DETECTED Final   Bordetella pertussis NOT DETECTED NOT DETECTED Final   Chlamydophila pneumoniae NOT DETECTED NOT DETECTED Final   Mycoplasma pneumoniae NOT DETECTED NOT DETECTED Final    Comment: Performed at [facility], [place] [address], [place], [place] [address]     Time coordinating discharge: Over 30 minutes  SIGNED:   Birdie Hopes, MD  [facility] 07/15/2016, 10:21 AM Pager   If 7PM-7AM, please contact night-coverage [email or web address] Password [email or web address]

## 2016-07-15 NOTE — Evaluation (Signed)
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 Chest xray revealed bilateral  pleural effusions,  RLL infiltrates., Hypotensive in Ed.  Clinical Impression  The  Patient is ambulating without  Assistance. Oxygen saturation on RA for 410 =lowest 84 on heart monitor(no waveform indicated), portable monitor =87%.  See RN qualification notes from previous test. No further PT needs at this  Time. PT will sign off.    Follow Up Recommendations No PT follow up    Equipment Recommendations  None recommended by PT    Recommendations for Other Services       Precautions / Restrictions Precautions Precaution Comments: monitor sats      Mobility  Bed Mobility               General bed mobility comments: inreclienr  Transfers Overall transfer level: Independent                  Ambulation/Gait Ambulation/Gait assistance: Supervision Ambulation Distance (Feet): 420 Feet Assistive device: None Gait Pattern/deviations: WFL(Within Functional Limits)     General Gait Details: % but  does not show wavefiorm. Previously monitored by RN for ambulation with and with so patient declined anothr walk at this time on oxygen. Sats returned to 96% on 2 l. after ambulating.  Stairs            Wheelchair Mobility    Modified Rankin (Stroke Patients Only)       Balance Overall balance assessment: Independent                                           Pertinent Vitals/Pain Pain Assessment: No/denies pain    Home Living Family/patient expects to be discharged to:: Private residence Living Arrangements: Spouse/significant other Available Help at Discharge:  Family Type of Home: House Home Access: Stairs to enter Entrance Stairs-Rails: None Technical brewer of Steps: 3 Home Layout: One level Home Equipment: None      Prior Function Level of Independence: Independent               Hand Dominance        Extremity/Trunk Assessment   Upper Extremity Assessment Upper Extremity Assessment: Overall WFL for tasks assessed    Lower Extremity Assessment Lower Extremity Assessment: Overall WFL for tasks assessed    Cervical / Trunk Assessment Cervical / Trunk Assessment: Normal  Communication   Communication: No difficulties  Cognition Arousal/Alertness: Awake/alert Behavior During Therapy: WFL for tasks assessed/performed Overall Cognitive Status: Within Functional Limits for tasks assessed                                        General Comments      Exercises     Assessment/Plan    PT Assessment Patent does not need any further PT services  PT Problem List         PT Treatment Interventions      PT Goals (Current goals can be found in the Care Plan section)  Acute Rehab PT Goals PT Goal Formulation: All assessment and education complete, DC therapy    Frequency     Barriers to discharge        Co-evaluation               End of Session   Activity Tolerance: Patient tolerated treatment well Patient left: in chair;with call bell/phone within reach Nurse Communication: Mobility status PT Visit Diagnosis: Difficulty in walking, not elsewhere classified (R26.2)    Time: 7542-3702 PT Time Calculation (min) (ACUTE ONLY): 21 min   Charges:   PT Evaluation $PT Eval Low Complexity: 1 Procedure     PT G Codes:        {Myla Mauriello PT [phone]   Claretha Cooper 07/15/2016, 9:30 AM

## 2016-07-15 NOTE — Care Management Important Message (Signed)
Important Message  Patient Details  Name: Joshua Fernandez MRN: [id number] Date of Birth: [birth year]   Medicare Important Message Given:  Yes    Kerin Salen 07/15/2016, 12:09 [place] Message  Patient Details  Name: Joshua Fernandez MRN: [id number] Date of Birth: Nov 29, 1960   Medicare Important Message Given:  Yes    Kerin Salen 07/15/2016, 12:09 PM

## 2016-07-15 NOTE — Progress Notes (Signed)
Pt's Home O2 referral given to [facility] [place].

## 2016-07-15 NOTE — Progress Notes (Signed)
ANTICOAGULATION CONSULT NOTE - Follow Up Consult  Pharmacy Consult for warfarin Indication: h/o LV thrombus  No Known Allergies  Patient Measurements: Height: 5\' 11"  (180.3 cm) Weight: 296 lb 8 oz (134.5 kg) IBW/kg (Calculated) : 75.3  Vital Signs: Temp: 97.6 F (36.4 C) (04/16 0521) Temp Source: Oral (04/16 0521) BP: 153/74 (04/16 1004) Pulse Rate: 60 (04/16 0521)  Labs:  Recent Labs  07/13/16 0508 07/14/16 0447 07/15/16 0437  HGB  --  11.6* 10.1*  HCT  --  35.2* 31.8*  PLT  --  241 250  LABPROT 28.1* 23.8* 21.9*  INR 2.57 2.09 1.89  CREATININE  --  3.20* 3.25*    Estimated Creatinine Clearance: 36 mL/min (A) (by C-G formula based on SCr of 3.25 mg/dL (H)).  Assessment: 24 yoM admitted with acute resp failure, septic shock 2/2 CAP, AoCKD on chronic warfarin PTA for h/o LV thrombus managed by anticoagulation clinic.  Per recent clinic notes, patient is supposed to be on 10 mg daily.  Admission INR therapeutic at 2.66.  Pharmacy consulted to continue management of warfarin while admitted.  Today, 07/15/2016:  INR is subtherapeutic at 1.89. (dose held on 4/12 and 4/12 d/t supratherapeutic INR-- pt was likely more sensitive to warfarin d/t NPO status during that time period)  CBC stable   No bleeding issues documented.  Drug interactions with warfarin: azithromycin.  Cardiac diet (~100% meal intake documented)  Goal of Therapy:  INR 2-3  Plan:   Warfarin 10 mg PO x1  Daily INR  Peggyann Juba, PharmD, BCPS Pager: [phone] 07/15/2016 2:26 PM

## 2016-07-15 NOTE — Progress Notes (Signed)
Patient discharged home with wife, discharge instructions given and explained to patient/wife and they verbalized understanding, denies any pain/distress. Accompanied home by wife, no wound noted, skin intact.

## 2016-11-10 ENCOUNTER — Emergency Department (HOSPITAL_BASED_OUTPATIENT_CLINIC_OR_DEPARTMENT_OTHER)
Admission: EM | Admit: 2016-11-10 | Discharge: 2016-11-10 | Disposition: A | Discharge status: Home / Self Care | Payer: Medicare Other | Attending: Emergency Medicine | Admitting: Emergency Medicine

## 2016-11-10 ENCOUNTER — Encounter (HOSPITAL_BASED_OUTPATIENT_CLINIC_OR_DEPARTMENT_OTHER): Payer: Self-pay | Admitting: Emergency Medicine

## 2016-11-10 DIAGNOSIS — E213 Hyperparathyroidism, unspecified: Secondary | ICD-10-CM | POA: Diagnosis not present

## 2016-11-10 DIAGNOSIS — I129 Hypertensive chronic kidney disease with stage 1 through stage 4 chronic kidney disease, or unspecified chronic kidney disease: Secondary | ICD-10-CM | POA: Insufficient documentation

## 2016-11-10 DIAGNOSIS — M10071 Idiopathic gout, right ankle and foot: Secondary | ICD-10-CM | POA: Diagnosis not present

## 2016-11-10 DIAGNOSIS — Z79899 Other long term (current) drug therapy: Secondary | ICD-10-CM | POA: Diagnosis not present

## 2016-11-10 DIAGNOSIS — Z7901 Long term (current) use of anticoagulants: Secondary | ICD-10-CM | POA: Insufficient documentation

## 2016-11-10 DIAGNOSIS — N183 Chronic kidney disease, stage 3 (moderate): Secondary | ICD-10-CM | POA: Diagnosis not present

## 2016-11-10 DIAGNOSIS — Z7984 Long term (current) use of oral hypoglycemic drugs: Secondary | ICD-10-CM | POA: Diagnosis not present

## 2016-11-10 DIAGNOSIS — M79671 Pain in right foot: Secondary | ICD-10-CM | POA: Diagnosis present

## 2016-11-10 DIAGNOSIS — I2510 Atherosclerotic heart disease of native coronary artery without angina pectoris: Secondary | ICD-10-CM | POA: Insufficient documentation

## 2016-11-10 DIAGNOSIS — I509 Heart failure, unspecified: Secondary | ICD-10-CM | POA: Insufficient documentation

## 2016-11-10 DIAGNOSIS — E1122 Type 2 diabetes mellitus with diabetic chronic kidney disease: Secondary | ICD-10-CM | POA: Insufficient documentation

## 2016-11-10 MED ORDER — HYDROCODONE-ACETAMINOPHEN 5-325 MG PO TABS
2.0000 | ORAL_TABLET | Freq: Once | ORAL | Status: AC
  Administered 2016-11-10: 2 via ORAL
  Filled 2016-11-10: qty 2

## 2016-11-10 MED ORDER — HYDROCODONE-ACETAMINOPHEN 5-325 MG PO TABS: 1.0000 | ORAL_TABLET | Freq: Four times a day (QID) | ORAL | 0 refills | Status: DC | PRN

## 2016-11-10 MED ORDER — PREDNISONE 50 MG PO TABS
60.0000 mg | ORAL_TABLET | Freq: Once | ORAL | Status: AC
  Administered 2016-11-10: 60 mg via ORAL
  Filled 2016-11-10: qty 1

## 2016-11-10 MED ORDER — PREDNISONE 20 MG PO TABS: ORAL_TABLET | ORAL | 0 refills | Status: DC

## 2016-11-10 NOTE — ED Provider Notes (Signed)
[facility] DEPT [facility] Provider Note   CSN: [id number] Arrival date & time: 11/10/16  1425  By signing my name below, I, Margit Banda, attest that this documentation has been prepared under the direction and in the presence of Lajean Saver, MD. Electronically Signed: Margit Banda, ED Scribe. 11/10/16. 4:02 PM.  History   Chief Complaint Chief Complaint  Patient presents with  . Foot Pain    HPI Joshua Fernandez is a 56 y.o. male who presents to the Emergency Department complaining of right foot pain that started yesterday. He reports he woke up and it was swollen. Pt states it feels like past episodes of gout. Associated sx include   Patient c/o right foot/ankle pain and swelling for past day. Pain constant. Dull, moderate-sev. Worse w palpation. Same as with prior gout. Does not feel ill or sick. No nv. No fever or chills. No other arthralgias or pain. Denies any trauma or injury to area.       Past Medical History:  Diagnosis Date  . AKI (acute kidney injury) ([place])   . Cardiomyopathy ([place])   . Cataract   . CHF (congestive heart failure) ([place])   . Coronary artery disease   . Diabetes mellitus without complication ([place])   . Gout   . Hyperlipemia   . Hyperparathyroidism ([place])   . Hypertension   . Open-angle glaucoma   . Pneumonia   . Renal disorder    chronic kidney disease stage 3  . Rheumatoid arthritis ([place])   . 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 Vitamin D deficiency     Patient Active Problem List   Diagnosis Date Noted  . Community acquired pneumonia 07/12/2016  . Acute respiratory failure with hypoxia ([place]) 07/12/2016  . Hypokalemia 07/12/2016  . Hypomagnesemia 07/12/2016  . CKD (chronic kidney disease), stage III 07/12/2016  . Sepsis ([place]) 07/10/2016    Past Surgical History:  Procedure Laterality Date  . FRACTURE SURGERY         Home Medications    Prior to Admission medications   Medication Sig Start Date End  Date Taking? Authorizing Provider  amLODipine (NORVASC) 5 MG tablet Take 1 tablet (5 mg total) by mouth every morning. 07/15/16   Verlee Monte, MD  atorvastatin (LIPITOR) 40 MG tablet Take 40 mg by mouth every morning.     [provider]  carvedilol (COREG) 25 MG tablet Take 25 mg by mouth 2 (two) times daily with a meal.    [provider]  cefUROXime (CEFTIN) 500 MG tablet Take 1 tablet (500 mg total) by mouth 2 (two) times daily with a meal. 07/15/16   Verlee Monte, MD  cholecalciferol (VITAMIN D) 400 units TABS tablet Take 4,000 Units by mouth every morning.     [provider]  digoxin (LANOXIN) 0.25 MG tablet Take 125 mg by mouth every morning.     [provider]  famotidine (PEPCID) 10 MG tablet Take 10 mg by mouth every morning.    [provider]  glipiZIDE (GLUCOTROL XL) 10 MG 24 hr tablet Take 10 mg by mouth daily with breakfast.    [provider]  guaiFENesin (MUCINEX) 600 MG 12 hr tablet Take 600 mg by mouth every morning.    [provider]  hydrALAZINE (APRESOLINE) 50 MG tablet Take 1 tablet (50 mg total) by mouth 3 (three) times daily. 07/15/16   Verlee Monte, MD  isosorbide mononitrate (IMDUR) 30 MG 24 hr tablet Take 1  tablet (30 mg total) by mouth daily. 07/15/16   Verlee Monte, MD  latanoprost (XALATAN) 0.005 % ophthalmic solution Place 1 drop into both eyes at bedtime as needed (irritation).     [provider]  potassium chloride SA (K-DUR,KLOR-CON) 20 MEQ tablet Take 20 mEq by mouth every morning.     [provider]  torsemide (DEMADEX) 20 MG tablet Take 1 tablet (20 mg total) by mouth 2 (two) times daily. 07/15/16   Verlee Monte, MD  warfarin (COUMADIN) 10 MG tablet Take 5-10 mg by mouth See admin instructions. Take 10 mg by mouth daily except Saturday take 5 mg    [provider]    Family History History reviewed. No pertinent family history.  Social History Social History    Substance Use Topics  . Smoking status: Never Smoker  . Smokeless tobacco: Never Used  . Alcohol use No     Allergies   Patient has no known allergies.   Review of Systems Review of Systems  Constitutional: Negative for fever.  HENT: Negative for sore throat.   Eyes: Negative for redness.  Respiratory: Negative for shortness of breath.   Cardiovascular: Negative for chest pain.  Gastrointestinal: Negative for abdominal pain.  Genitourinary: Negative for flank pain.  Musculoskeletal: Negative for back pain.  Skin: Negative for rash.  Neurological: Negative for headaches.  Hematological: Does not bruise/bleed easily.  Psychiatric/Behavioral: Negative for confusion.     Physical Exam Updated Vital Signs BP (!) 157/81 (BP Location: Right Arm)   Pulse 82   Temp 98.6 F (37 C) (Oral)   Resp 20   Ht 5\' 11"  (1.803 m)   Wt 280 lb (127 kg)   SpO2 100%   BMI 39.05 kg/m   Physical Exam  Constitutional: He is oriented to person, place, and time. He appears well-developed and well-nourished.  HENT:  Head: Normocephalic.  Eyes: EOM are normal.  Neck: Normal range of motion.  Cardiovascular: Intact distal pulses.   Pulmonary/Chest: Effort normal.  Abdominal: He exhibits no distension.  Musculoskeletal: Normal range of motion.  Mild swelling, tenderness and sl increased warmth to right ankle and foot. Dp/pt 2+.  Good passive rom at ankle without pain, no findings septic joint. Exam c/w gout.   Neurological: He is alert and oriented to person, place, and time.  Skin: No rash noted.  Psychiatric: He has a normal mood and affect.  Nursing note and vitals reviewed.    ED Treatments / Results  DIAGNOSTIC STUDIES: Oxygen Saturation is 100% on RA, normal by my interpretation.   COORDINATION OF CARE: 4:02 PM-Discussed next steps with pt. Pt verbalized understanding and is agreeable with the plan.   Labs (all labs ordered are listed, but only abnormal results are  displayed) Labs Reviewed - No data to display  EKG  EKG Interpretation None       Radiology No results found.  Procedures Procedures (including critical care time)  Medications Ordered in ED Medications - No data to display   Initial Impression / Assessment and Plan / ED Course  I have reviewed the triage vital signs and the nursing notes.  Pertinent labs & imaging results that were available during my care of the patient were reviewed by me and considered in my medical decision making (see chart for details).  No meds pta. Confirmed nkda. Pt has ride, does not have to drive.  Hydrocodone po. Prednisone po.    Final Clinical Impressions(s) / ED Diagnoses   Final diagnoses:  None    New Prescriptions New Prescriptions   No medications on file   I personally performed the services described in this documentation, which was scribed in my presence. The recorded information has been reviewed and considered. Lajean Saver, MD     Lajean Saver, MD 11/10/16 [id number]

## 2016-11-10 NOTE — Discharge Instructions (Signed)
It was our pleasure to provide your ER care today - we hope that you feel better.  Take prednisone as prescribed.  You may take hydrocodone as need for pain. No driving for the next 6 hours or when taking hydrocodone. Also, do not take tylenol or acetaminophen containing medication when taking hydrocodone.  Return to ER if worse, new symptoms, fevers, intractable pain, other concern.

## 2016-11-10 NOTE — ED Triage Notes (Signed)
Patient states that he has a history of gout - patient reports that he has had pain and swelling to his right foot since yesterday

## 2017-02-03 ENCOUNTER — Observation Stay (HOSPITAL_BASED_OUTPATIENT_CLINIC_OR_DEPARTMENT_OTHER)
Admission: EM | Admit: 2017-02-03 | Discharge: 2017-02-04 | Disposition: A | Discharge status: Home / Self Care | Payer: Medicare Other | Attending: Family Medicine | Admitting: Family Medicine

## 2017-02-03 ENCOUNTER — Emergency Department (HOSPITAL_BASED_OUTPATIENT_CLINIC_OR_DEPARTMENT_OTHER): Payer: Medicare Other

## 2017-02-03 ENCOUNTER — Encounter (HOSPITAL_BASED_OUTPATIENT_CLINIC_OR_DEPARTMENT_OTHER): Payer: Self-pay | Admitting: Emergency Medicine

## 2017-02-03 ENCOUNTER — Other Ambulatory Visit: Payer: Self-pay

## 2017-02-03 DIAGNOSIS — Z79899 Other long term (current) drug therapy: Secondary | ICD-10-CM | POA: Insufficient documentation

## 2017-02-03 DIAGNOSIS — E876 Hypokalemia: Secondary | ICD-10-CM | POA: Diagnosis present

## 2017-02-03 DIAGNOSIS — J181 Lobar pneumonia, unspecified organism: Principal | ICD-10-CM | POA: Diagnosis present

## 2017-02-03 DIAGNOSIS — Z7901 Long term (current) use of anticoagulants: Secondary | ICD-10-CM | POA: Diagnosis not present

## 2017-02-03 DIAGNOSIS — E1122 Type 2 diabetes mellitus with diabetic chronic kidney disease: Secondary | ICD-10-CM | POA: Diagnosis not present

## 2017-02-03 DIAGNOSIS — I509 Heart failure, unspecified: Secondary | ICD-10-CM | POA: Insufficient documentation

## 2017-02-03 DIAGNOSIS — M069 Rheumatoid arthritis, unspecified: Secondary | ICD-10-CM | POA: Diagnosis present

## 2017-02-03 DIAGNOSIS — Z7984 Long term (current) use of oral hypoglycemic drugs: Secondary | ICD-10-CM | POA: Diagnosis not present

## 2017-02-03 DIAGNOSIS — N183 Chronic kidney disease, stage 3 unspecified: Secondary | ICD-10-CM | POA: Diagnosis present

## 2017-02-03 DIAGNOSIS — E785 Hyperlipidemia, unspecified: Secondary | ICD-10-CM | POA: Insufficient documentation

## 2017-02-03 DIAGNOSIS — I5022 Chronic systolic (congestive) heart failure: Secondary | ICD-10-CM

## 2017-02-03 DIAGNOSIS — I130 Hypertensive heart and chronic kidney disease with heart failure and stage 1 through stage 4 chronic kidney disease, or unspecified chronic kidney disease: Secondary | ICD-10-CM | POA: Diagnosis not present

## 2017-02-03 DIAGNOSIS — E119 Type 2 diabetes mellitus without complications: Secondary | ICD-10-CM

## 2017-02-03 DIAGNOSIS — N184 Chronic kidney disease, stage 4 (severe): Secondary | ICD-10-CM | POA: Diagnosis not present

## 2017-02-03 DIAGNOSIS — J189 Pneumonia, unspecified organism: Secondary | ICD-10-CM | POA: Diagnosis present

## 2017-02-03 DIAGNOSIS — E559 Vitamin D deficiency, unspecified: Secondary | ICD-10-CM | POA: Diagnosis not present

## 2017-02-03 DIAGNOSIS — I429 Cardiomyopathy, unspecified: Secondary | ICD-10-CM

## 2017-02-03 DIAGNOSIS — M109 Gout, unspecified: Secondary | ICD-10-CM | POA: Diagnosis not present

## 2017-02-03 DIAGNOSIS — I2510 Atherosclerotic heart disease of native coronary artery without angina pectoris: Secondary | ICD-10-CM | POA: Diagnosis not present

## 2017-02-03 LAB — CBC
HCT: 34.9 % — ABNORMAL LOW (ref 39.0–52.0)
HEMATOCRIT: 34.9 % — AB (ref 39.0–52.0)
HEMOGLOBIN: 11.3 g/dL — AB (ref 13.0–17.0)
Hemoglobin: 11.2 g/dL — ABNORMAL LOW (ref 13.0–17.0)
MCH: 24.1 pg — AB (ref 26.0–34.0)
MCH: 24.2 pg — AB (ref 26.0–34.0)
MCHC: 32.4 g/dL (ref 30.0–36.0)
MCHC: 32.1 g/dL (ref 30.0–36.0)
MCV: 74.6 fL — AB (ref 78.0–100.0)
MCV: 75.4 fL — ABNORMAL LOW (ref 78.0–100.0)
Platelets: 215 10*3/uL (ref 150–400)
Platelets: 142 10*3/uL — ABNORMAL LOW (ref 150–400)
RBC: 4.68 MIL/uL (ref 4.22–5.81)
RBC: 4.63 MIL/uL (ref 4.22–5.81)
RDW: 16.5 % — ABNORMAL HIGH (ref 11.5–15.5)
RDW: 16 % — AB (ref 11.5–15.5)
WBC: 8.8 10*3/uL (ref 4.0–10.5)
WBC: 9.1 10*3/uL (ref 4.0–10.5)

## 2017-02-03 LAB — RAPID URINE DRUG SCREEN, HOSP PERFORMED
Amphetamines: NOT DETECTED
BARBITURATES: NOT DETECTED
BENZODIAZEPINES: NOT DETECTED
COCAINE: NOT DETECTED
OPIATES: NOT DETECTED
TETRAHYDROCANNABINOL: NOT DETECTED

## 2017-02-03 LAB — DIGOXIN LEVEL: Digoxin Level: 1.1 ng/mL (ref 0.8–2.0)

## 2017-02-03 LAB — BASIC METABOLIC PANEL
Anion gap: 7 (ref 5–15)
BUN: 30 mg/dL — AB (ref 6–20)
CALCIUM: 7.8 mg/dL — AB (ref 8.9–10.3)
CHLORIDE: 110 mmol/L (ref 101–111)
CO2: 23 mmol/L (ref 22–32)
CREATININE: 3.64 mg/dL — AB (ref 0.61–1.24)
GFR calc Af Amer: 20 mL/min — ABNORMAL LOW (ref >60)
GFR calc non Af Amer: 17 mL/min — ABNORMAL LOW (ref >60)
GLUCOSE: 156 mg/dL — AB (ref 65–99)
Potassium: 3.2 mmol/L — ABNORMAL LOW (ref 3.5–5.1)
Sodium: 140 mmol/L (ref 135–145)

## 2017-02-03 LAB — PROTIME-INR
INR: 0.97
Prothrombin Time: 12.8 seconds (ref 11.4–15.2)

## 2017-02-03 LAB — INFLUENZA PANEL BY PCR (TYPE A & B)
Influenza A By PCR: NEGATIVE
Influenza B By PCR: NEGATIVE

## 2017-02-03 LAB — GLUCOSE, CAPILLARY
Glucose-Capillary: 126 mg/dL — ABNORMAL HIGH (ref 65–99)
Glucose-Capillary: 161 mg/dL — ABNORMAL HIGH (ref 65–99)

## 2017-02-03 LAB — CREATININE, SERUM
CREATININE: 3.28 mg/dL — AB (ref 0.61–1.24)
GFR calc Af Amer: 23 mL/min — ABNORMAL LOW (ref >60)
GFR, EST NON AFRICAN AMERICAN: 20 mL/min — AB (ref >60)

## 2017-02-03 LAB — I-STAT CG4 LACTIC ACID, ED: Lactic Acid, Venous: 0.82 mmol/L (ref 0.5–1.9)

## 2017-02-03 MED ORDER — DIGOXIN 125 MCG PO TABS
0.1250 mg | ORAL_TABLET | Freq: Every day | ORAL | Status: DC
  Administered 2017-02-04: 0.125 mg via ORAL
  Filled 2017-02-03: qty 1

## 2017-02-03 MED ORDER — ATORVASTATIN CALCIUM 40 MG PO TABS
40.0000 mg | ORAL_TABLET | ORAL | Status: DC
  Administered 2017-02-04: 40 mg via ORAL
  Filled 2017-02-03: qty 1

## 2017-02-03 MED ORDER — ALBUTEROL SULFATE (2.5 MG/3ML) 0.083% IN NEBU: 2.5000 mg | INHALATION_SOLUTION | RESPIRATORY_TRACT | Status: DC | PRN

## 2017-02-03 MED ORDER — DEXTROSE 5 % IV SOLN
500.0000 mg | Freq: Once | INTRAVENOUS | Status: AC
  Administered 2017-02-03: 500 mg via INTRAVENOUS
  Filled 2017-02-03: qty 500

## 2017-02-03 MED ORDER — FUROSEMIDE 10 MG/ML IJ SOLN
20.0000 mg | Freq: Once | INTRAMUSCULAR | Status: AC
  Administered 2017-02-03: 20 mg via INTRAVENOUS
  Filled 2017-02-03: qty 2

## 2017-02-03 MED ORDER — IPRATROPIUM-ALBUTEROL 0.5-2.5 (3) MG/3ML IN SOLN
3.0000 mL | Freq: Four times a day (QID) | RESPIRATORY_TRACT | Status: DC
  Administered 2017-02-03: 3 mL via RESPIRATORY_TRACT
  Filled 2017-02-03: qty 3

## 2017-02-03 MED ORDER — DEXTROSE 5 % IV SOLN
1.0000 g | Freq: Once | INTRAVENOUS | Status: AC
  Administered 2017-02-03: 1 g via INTRAVENOUS
  Filled 2017-02-03: qty 10

## 2017-02-03 MED ORDER — AZITHROMYCIN 500 MG IV SOLR
INTRAVENOUS | Status: AC
  Filled 2017-02-03: qty 500

## 2017-02-03 MED ORDER — ALBUTEROL SULFATE (2.5 MG/3ML) 0.083% IN NEBU
2.5000 mg | INHALATION_SOLUTION | RESPIRATORY_TRACT | Status: DC
  Administered 2017-02-03 (×2): 2.5 mg via RESPIRATORY_TRACT
  Filled 2017-02-03 (×2): qty 3

## 2017-02-03 MED ORDER — POTASSIUM CHLORIDE CRYS ER 20 MEQ PO TBCR
40.0000 meq | EXTENDED_RELEASE_TABLET | Freq: Once | ORAL | Status: AC
  Administered 2017-02-03: 40 meq via ORAL
  Filled 2017-02-03: qty 2

## 2017-02-03 MED ORDER — LISINOPRIL 20 MG PO TABS
40.0000 mg | ORAL_TABLET | Freq: Every day | ORAL | Status: DC
  Administered 2017-02-04: 40 mg via ORAL
  Filled 2017-02-03: qty 2

## 2017-02-03 MED ORDER — DEXTROSE 5 % IV SOLN
1.0000 g | INTRAVENOUS | Status: DC
  Administered 2017-02-04: 1 g via INTRAVENOUS
  Filled 2017-02-03: qty 10

## 2017-02-03 MED ORDER — MOMETASONE FURO-FORMOTEROL FUM 200-5 MCG/ACT IN AERO
2.0000 | INHALATION_SPRAY | Freq: Two times a day (BID) | RESPIRATORY_TRACT | Status: DC
  Administered 2017-02-03 – 2017-02-04 (×2): 2 via RESPIRATORY_TRACT
  Filled 2017-02-03: qty 8.8

## 2017-02-03 MED ORDER — CARVEDILOL 25 MG PO TABS
25.0000 mg | ORAL_TABLET | Freq: Two times a day (BID) | ORAL | Status: DC
  Administered 2017-02-03 – 2017-02-04 (×2): 25 mg via ORAL
  Filled 2017-02-03 (×2): qty 1

## 2017-02-03 MED ORDER — DEXTROSE 5 % IV SOLN
500.0000 mg | INTRAVENOUS | Status: DC
  Administered 2017-02-04: 500 mg via INTRAVENOUS
  Filled 2017-02-03: qty 500

## 2017-02-03 MED ORDER — SODIUM CHLORIDE 0.9 % IV BOLUS (SEPSIS)
500.0000 mL | Freq: Once | INTRAVENOUS | Status: AC
  Administered 2017-02-03: 500 mL via INTRAVENOUS

## 2017-02-03 MED ORDER — LATANOPROST 0.005 % OP SOLN
1.0000 [drp] | Freq: Every evening | OPHTHALMIC | Status: DC | PRN
  Filled 2017-02-03: qty 2.5

## 2017-02-03 MED ORDER — INSULIN ASPART 100 UNIT/ML ~~LOC~~ SOLN
0.0000 [IU] | Freq: Three times a day (TID) | SUBCUTANEOUS | Status: DC
  Administered 2017-02-03: 1 [IU] via SUBCUTANEOUS
  Administered 2017-02-04: 2 [IU] via SUBCUTANEOUS

## 2017-02-03 MED ORDER — ALBUTEROL SULFATE (2.5 MG/3ML) 0.083% IN NEBU
2.5000 mg | INHALATION_SOLUTION | Freq: Four times a day (QID) | RESPIRATORY_TRACT | Status: DC
  Administered 2017-02-04 (×2): 2.5 mg via RESPIRATORY_TRACT
  Filled 2017-02-03 (×2): qty 3

## 2017-02-03 MED ORDER — GUAIFENESIN ER 600 MG PO TB12
600.0000 mg | ORAL_TABLET | ORAL | Status: DC
Start: 2017-02-04 — End: 2017-02-04
  Administered 2017-02-04: 600 mg via ORAL
  Filled 2017-02-03: qty 1

## 2017-02-03 MED ORDER — HEPARIN SODIUM (PORCINE) 5000 UNIT/ML IJ SOLN
5000.0000 [IU] | Freq: Three times a day (TID) | INTRAMUSCULAR | Status: DC
  Administered 2017-02-03 – 2017-02-04 (×2): 5000 [IU] via SUBCUTANEOUS
  Filled 2017-02-03 (×2): qty 1

## 2017-02-03 NOTE — ED Triage Notes (Signed)
Patient reports cough, nasal congestion, facial pain since Saturday.

## 2017-02-03 NOTE — H&P (Signed)
History and Physical  Joshua Fernandez ZYS:[id number] DOB: 06-Sep-1960 DOA: 02/03/2017  Referring physician: EDP PCP: Wallene Dales, MD   Chief Complaint: cough, congestion  HPI: Joshua Fernandez is a 56 y.o. male   (Both wife and patient has poor insight regarding his medical history. they are not able to tell what medication he is suppose to be on.)  Patient with h/o noninsulin dependent DM, RA (not currently on meds), NICM, LV thrombus (cardiology stopped warfarin in 11/2016), followed at [facility]. H/o CKD stage III-VI w/ BL cr 2.7 to 3.1, Presents to ER at [facility] due to cough. no hypoxia at rest, denies chest pain. he is  tachypneic at rest,  respiration rate 30 on presentation to ED ,no fever ,no leukocytosis, no lactic acidosis.  flu swab negative. Chest x-ray concerning for bilateral pneumonia.  He is wheezing on exam.  also have bilateral lower extremity edema per EDP.  He is given Zithromax and DuoNeb,  1 dose of IV Lasix , he is transferred to [facility] long for further management.  Patient report Sick contact (his son had a cold recently), he is not uptodate with vaccination.  Review of Systems:  Detail per HPI, Review of systems are otherwise negative  Past Medical History:  Diagnosis Date  . AKI (acute kidney injury) ([place])   . Cardiomyopathy ([place])   . Cataract   . CHF (congestive heart failure) ([place])   . Coronary artery disease   . Diabetes mellitus without complication ([place])   . Gout   . Hyperlipemia   . Hyperparathyroidism ([place])   . Hypertension   . Open-angle glaucoma   . Pneumonia   . Renal disorder    chronic kidney disease stage 3  . Rheumatoid arthritis ([place])   . 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 Vitamin D deficiency    Past Surgical History:  Procedure Laterality Date  . FRACTURE SURGERY     Social History:  reports that  has never smoked. he has never used smokeless tobacco. He reports that he does not drink  alcohol or use drugs. Patient lives at home & is able to participate in activities of daily living independently   No Known Allergies  History reviewed. No pertinent family history.    Prior to Admission medications   Medication Sig Start Date End Date Taking? Authorizing Provider  albuterol (PROVENTIL HFA;VENTOLIN HFA) 108 (90 Base) MCG/ACT inhaler Inhale 2 puffs every 4 (four) hours into the lungs. 07/18/16  Yes [provider]  amLODipine (NORVASC) 10 MG tablet Take 10 mg daily by mouth. 11/01/16  Yes [provider]  atorvastatin (LIPITOR) 40 MG tablet Take 40 mg by mouth every morning.    Yes [provider]  budesonide-formoterol (SYMBICORT) 160-4.5 MCG/ACT inhaler Inhale 2 puffs 2 (two) times daily into the lungs. 07/18/16  Yes [provider]  carvedilol (COREG) 25 MG tablet Take 25 mg by mouth 2 (two) times daily with a meal.   Yes [provider]  cholecalciferol (VITAMIN D) 400 units TABS tablet Take 800 Units every morning by mouth.    Yes [provider]  digoxin (LANOXIN) 0.25 MG tablet Take 125 mg by mouth every morning.    Yes [provider]  famotidine (PEPCID) 10 MG tablet Take 10 mg by mouth every morning.   Yes [provider]  glipiZIDE (GLUCOTROL XL) 10 MG 24 hr tablet Take 10 mg by mouth daily with breakfast.   Yes  [provider]  guaiFENesin (MUCINEX) 600 MG 12 hr tablet Take 600 mg by mouth every morning.   Yes [provider]  hydrALAZINE (APRESOLINE) 50 MG tablet Take 1 tablet (50 mg total) by mouth 3 (three) times daily. 07/15/16  Yes Verlee Monte, MD  isosorbide mononitrate (IMDUR) 30 MG 24 hr tablet Take 1 tablet (30 mg total) by mouth daily. 07/15/16  Yes Verlee Monte, MD  latanoprost (XALATAN) 0.005 % ophthalmic solution Place 1 drop into both eyes at bedtime as needed (irritation).    Yes [provider]  lisinopril (PRINIVIL,ZESTRIL) 40 MG tablet Take 40 mg daily  by mouth. 12/20/16  Yes [provider]  potassium chloride SA (K-DUR,KLOR-CON) 20 MEQ tablet Take 20 mEq by mouth every morning.    Yes [provider]  torsemide (DEMADEX) 20 MG tablet Take 1 tablet (20 mg total) by mouth 2 (two) times daily. 07/15/16  Yes Verlee Monte, MD  warfarin (COUMADIN) 10 MG tablet Take 5-10 mg by mouth See admin instructions. Take 10 mg by mouth daily except Saturday take 5 mg   Yes [provider]  amLODipine (NORVASC) 5 MG tablet Take 1 tablet (5 mg total) by mouth every morning. Patient not taking: Reported on 02/03/2017 07/15/16   Verlee Monte, MD  cefUROXime (CEFTIN) 500 MG tablet Take 1 tablet (500 mg total) by mouth 2 (two) times daily with a meal. Patient not taking: Reported on 02/03/2017 07/15/16   Verlee Monte, MD  HYDROcodone-acetaminophen (NORCO/VICODIN) 5-325 MG tablet Take 1-2 tablets by mouth every 6 (six) hours as needed for moderate pain. Patient not taking: Reported on 02/03/2017 11/10/16   Lajean Saver, MD  predniSONE (DELTASONE) 20 MG tablet 3 po once a day for 2 days, then 2 po once a day for 3 days, then 1 po once a day for 3 days Patient not taking: Reported on 02/03/2017 11/11/16   Lajean Saver, MD   Per cardiology office med rec from 11/2016: Patient is on the following meds: . albuterol 90 mcg/actuation inhaler Inhale 2 puffs into the lungs every 4 hours. 1 Inhaler 1  . amLODIPine (NORVASC) 10 MG tablet Take 1 tablet (10 mg total) by mouth daily. (Patient taking differently: Take 5 mg by mouth daily. ) 90 tablet 1  . atorvastatin (LIPITOR) 40 MG tablet TAKE ONE TABLET BY MOUTH ONCE DAILY 90 tablet 1  . budesonide-formoterol (SYMBICORT) 160-4.5 mcg/actuation inhaler Inhale 2 puffs into the lungs 2 times daily. 1 Inhaler 1  . carvedilol (COREG) 25 MG tablet Take 25 mg by mouth 2 times daily with meals.  . cholecalciferol, vitamin D3, (VITAMIN D3) 2,000 unit Cap Take 4,000 Units by mouth daily.  . colchicine 0.6 mg tablet  0.3mg  once daily x 3 day. 3 tablet 0  . digoxin (LANOXIN) 0.25 MG tablet Take 125 mcg by mouth daily.  Marland Kitchen glipiZIDE XL (GLUCOTROL XL) 10 MG 24 hr tablet TAKE 1 TABLET BY MOUTH ONCE DAILY 90 tablet 0  . latanoprost (XALATAN) 0.005 % ophthalmic solution 1 drop both eyes every night 5 mL 11  . lisinopril (PRINIVIL,ZESTRIL) 40 MG tablet TAKE ONE TABLET BY MOUTH ONCE DAILY 90 tablet 0  . potassium chloride ER (KLOR-CON M20) 20 MEQ extended release tablet Take 1 tablet (20 mEq total) by mouth daily. 90 tablet 1         Physical Exam: BP (!) 153/68 (BP Location: Left Arm)   Pulse 68   Temp 98.4 F (36.9 C) (Oral)   Resp [place] Kitchen)  28   Ht 5\' 11"  (1.803 m)   Wt 130.6 kg (287 lb 14.7 oz)   SpO2 93%   BMI 40.16 kg/m   General:  Drowsy, oriented x3 Eyes: PERRL ENT: unremarkable Neck: supple, no JVD Cardiovascular: RRR Respiratory: crackles at basis, mild wheezing, no rhonchi, overall very diminished, prolonged expiratory phase Abdomen: soft/NT/ND, positive bowel sounds Skin: no rash Musculoskeletal:  No edema Psychiatric: calm/cooperative Neurologic: no focal findings            Labs on Admission:  Basic Metabolic Panel: Recent Labs  Lab 02/03/17 0947  NA 140  K 3.2*  CL 110  CO2 23  GLUCOSE 156*  BUN 30*  CREATININE 3.64*  CALCIUM 7.8*   Liver Function Tests: No results for input(s): AST, ALT, ALKPHOS, BILITOT, PROT, ALBUMIN in the last 168 hours. No results for input(s): LIPASE, AMYLASE in the last 168 hours. No results for input(s): AMMONIA in the last 168 hours. CBC: Recent Labs  Lab 02/03/17 0947  WBC 8.8  HGB 11.3*  HCT 34.9*  MCV 74.6*  PLT 215   Cardiac Enzymes: No results for input(s): CKTOTAL, CKMB, CKMBINDEX, TROPONINI in the last 168 hours.  BNP (last 3 results) Recent Labs    07/10/16 0330  BNP 123.3*    ProBNP (last 3 results) No results for input(s): PROBNP in the last 8760 hours.  CBG: No results for input(s): GLUCAP in the last 168  hours.  Radiological Exams on Admission: Dg Chest 2 View  Result Date: 02/03/2017 CLINICAL DATA:  Cough, nasal congestion, and facial pain. EXAM: CHEST  2 VIEW COMPARISON:  08/05/2016. FINDINGS: Suspected cardiac enlargement. Low lung volumes. BILATERAL perihilar and lower lobe opacities appear represent early consolidation. This is worse on the LEFT. There is no effusion or pneumothorax. Bones are unremarkable. Findings represent an interval change from priors. IMPRESSION: Suspected early BILATERAL pneumonia, worse on the LEFT. Electronically Signed   By: Staci Righter M.D.   On: 02/03/2017 09:05      Assessment/Plan Present on Admission: . Lobar pneumonia ([place])  CAP:  No fever, no leukocytosis ,no hypoxia, no chest pain Sputum culture pending Start Rocephin, continue Zithromax  History of nonischemic cardiomyopathy, history of LV thrombus Patient report his recently taken off Coumadin, he cannot tell me how much diuretic he is on at home Per EDP he had lower extremity edema, he received Lasix in the ED, extremity edema on exam, continue home dose torsemide, continue Coreg and digoxin.  Check digoxin level.  Echocardiogram pending  Hypokalemia:  Replace k  CKDIV; close to baseline, renal dosing meds   Non-insulin-dependent type 2 diabetes Hold glipizide.  Start SSI  Hypertension, continue Coreg   DVT prophylaxis: heparin  Consultants:  None  Code Status: full   Family Communication:  Patient and wife  Disposition Plan: tele obs  Time spent: 80mins  Brianah Hopson MD, PhD [facility] Pager [phone] If 7PM-7AM, please contact night-coverage at [email or web address], password [facility]

## 2017-02-03 NOTE — Progress Notes (Signed)
Slyvester, Latona Male, 56 y.o., [birth year]  noninsulin dependent DM, RA (not currently on meds), NICM, LV thrombus (on warfarin), followed at [facility]. CKD stage III-VI w/ BL cr 2.7 to 3.1, Presents to ER at [facility] due to cough, no hypoxia at rest,he is  tachypneic respiration rate 30 ,no fever ,no leukocytosis, no lactic acidosis.  Chest x-ray concerning for bilateral pneumonia.  He is wheezing on exam.  Also have bilateral lower extremity edema per EDP.  He is given Zithromax and DuoNeb, I request 1 dose of IV Lasix  med telemetry obs.   Please call flow manager upon patient's  Arrival.

## 2017-02-03 NOTE — ED Provider Notes (Signed)
[facility] EMERGENCY DEPARTMENT Provider Note   CSN: [id number] Arrival date & time: 02/03/17  0818     History   Chief Complaint Chief Complaint  Patient presents with  . Nasal Congestion  . Cough    HPI Joshua Fernandez is a 56 y.o. male.  HPI  56 y.o. male with a hx of CAD, CHF, HTN, CKD Stage III, presents to the Emergency Department today due to cough/nasal congestion since Saturday. Notes subjective fevers. Endorses productive cough with sputum. No N/V/D. No CP/ABD pain. Notes mild shortness of breath. No sick contacts. Denies headaches or neck stiffness. Denies pain in general. OTC medications with minimal relief. No other symptoms noted .  Past Medical History:  Diagnosis Date  . AKI (acute kidney injury) ([place])   . Cardiomyopathy ([place])   . Cataract   . CHF (congestive heart failure) ([place])   . Coronary artery disease   . Diabetes mellitus without complication ([place])   . Gout   . Hyperlipemia   . Hyperparathyroidism ([place])   . Hypertension   . Open-angle glaucoma   . Pneumonia   . Renal disorder    chronic kidney disease stage 3  . Rheumatoid arthritis ([place])   . 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 Vitamin D deficiency     Patient Active Problem List   Diagnosis Date Noted  . Community acquired pneumonia 07/12/2016  . Acute respiratory failure with hypoxia ([place]) 07/12/2016  . Hypokalemia 07/12/2016  . Hypomagnesemia 07/12/2016  . CKD (chronic kidney disease), stage III ([place]) 07/12/2016  . Sepsis ([place]) 07/10/2016    Past Surgical History:  Procedure Laterality Date  . FRACTURE SURGERY         Home Medications    Prior to Admission medications   Medication Sig Start Date End Date Taking? Authorizing Provider  amLODipine (NORVASC) 5 MG tablet Take 1 tablet (5 mg total) by mouth every morning. 07/15/16   Verlee Monte, MD  atorvastatin (LIPITOR) 40 MG tablet Take 40 mg by mouth every morning.     [provider]  carvedilol (COREG) 25 MG tablet Take 25 mg by mouth 2 (two) times daily with a meal.    [provider]  cefUROXime (CEFTIN) 500 MG tablet Take 1 tablet (500 mg total) by mouth 2 (two) times daily with a meal. 07/15/16   Verlee Monte, MD  cholecalciferol (VITAMIN D) 400 units TABS tablet Take 4,000 Units by mouth every morning.     [provider]  digoxin (LANOXIN) 0.25 MG tablet Take 125 mg by mouth every morning.     [provider]  famotidine (PEPCID) 10 MG tablet Take 10 mg by mouth every morning.    [provider]  glipiZIDE (GLUCOTROL XL) 10 MG 24 hr tablet Take 10 mg by mouth daily with breakfast.    [provider]  guaiFENesin (MUCINEX) 600 MG 12 hr tablet Take 600 mg by mouth every morning.    [provider]  hydrALAZINE (APRESOLINE) 50 MG tablet Take 1 tablet (50 mg total) by mouth 3 (three) times daily. 07/15/16   Verlee Monte, MD  HYDROcodone-acetaminophen (NORCO/VICODIN) 5-325 MG tablet Take 1-2 tablets by mouth every 6 (six) hours as needed for moderate pain. 11/10/16   Lajean Saver, MD  isosorbide mononitrate (IMDUR) 30 MG 24 hr tablet Take 1 tablet (30 mg total) by mouth daily. 07/15/16   Verlee Monte, MD  latanoprost (XALATAN) 0.005 % ophthalmic solution Place  1 drop into both eyes at bedtime as needed (irritation).     [provider]  potassium chloride SA (K-DUR,KLOR-CON) 20 MEQ tablet Take 20 mEq by mouth every morning.     [provider]  predniSONE (DELTASONE) 20 MG tablet 3 po once a day for 2 days, then 2 po once a day for 3 days, then 1 po once a day for 3 days 11/11/16   Lajean Saver, MD  torsemide (DEMADEX) 20 MG tablet Take 1 tablet (20 mg total) by mouth 2 (two) times daily. 07/15/16   Verlee Monte, MD  warfarin (COUMADIN) 10 MG tablet Take 5-10 mg by mouth See admin instructions. Take 10 mg by mouth daily except Saturday take 5 mg    [provider]    Family History History  reviewed. No pertinent family history.  Social History Social History   Tobacco Use  . Smoking status: Never Smoker  . Smokeless tobacco: Never Used  Substance Use Topics  . Alcohol use: No  . Drug use: Not on file     Allergies   Patient has no known allergies.   Review of Systems Review of Systems ROS reviewed and all are negative for acute change except as noted in the HPI.  Physical Exam Updated Vital Signs BP (!) 151/78 (BP Location: Left Arm)   Pulse 75   Temp 98.5 F (36.9 C) (Oral)   Resp (!) 30   Ht 5\' 11"  (1.803 m)   Wt 114.8 kg (253 lb)   SpO2 92%   BMI 35.29 kg/m   Physical Exam  Constitutional: He is oriented to person, place, and time. He appears well-developed and well-nourished. No distress.  HENT:  Head: Normocephalic and atraumatic.  Right Ear: Tympanic membrane, external ear and ear canal normal.  Left Ear: Tympanic membrane, external ear and ear canal normal.  Nose: Nose normal.  Mouth/Throat: Uvula is midline, oropharynx is clear and moist and mucous membranes are normal. No trismus in the jaw. No oropharyngeal exudate, posterior oropharyngeal erythema or tonsillar abscesses.  Eyes: EOM are normal. Pupils are equal, round, and reactive to light.  Neck: Normal range of motion. Neck supple. No tracheal deviation present.  Cardiovascular: Normal rate, regular rhythm, S1 normal, S2 normal, normal heart sounds, intact distal pulses and normal pulses.  Pulmonary/Chest: Effort normal. Tachypnea noted. No respiratory distress. He has no decreased breath sounds. He has wheezes in the right upper field, the right lower field, the left upper field and the left lower field. He has no rhonchi. He has no rales.  Abdominal: Normal appearance and bowel sounds are normal. There is no tenderness.  Musculoskeletal: Normal range of motion.  Neurological: He is alert and oriented to person, place, and time.  Skin: Skin is warm and dry.  Psychiatric: He has a normal  mood and affect. His speech is normal and behavior is normal. Thought content normal.    ED Treatments / Results  Labs (all labs ordered are listed, but only abnormal results are displayed) Labs Reviewed  CBC - Abnormal; Notable for the following components:      Result Value   Hemoglobin 11.3 (*)    HCT 34.9 (*)    MCV 74.6 (*)    MCH 24.1 (*)    RDW 16.5 (*)    All other components within normal limits  BASIC METABOLIC PANEL - Abnormal; Notable for the following components:   Potassium 3.2 (*)    Glucose, Bld 156 (*)  BUN 30 (*)    Creatinine, Ser 3.64 (*)    Calcium 7.8 (*)    GFR calc non Af Amer 17 (*)    GFR calc Af Amer 20 (*)    All other components within normal limits  INFLUENZA PANEL BY PCR (TYPE A & B)  PROTIME-INR  I-STAT CG4 LACTIC ACID, ED    EKG  EKG Interpretation None       Radiology Dg Chest 2 View  Result Date: 02/03/2017 CLINICAL DATA:  Cough, nasal congestion, and facial pain. EXAM: CHEST  2 VIEW COMPARISON:  08/05/2016. FINDINGS: Suspected cardiac enlargement. Low lung volumes. BILATERAL perihilar and lower lobe opacities appear represent early consolidation. This is worse on the LEFT. There is no effusion or pneumothorax. Bones are unremarkable. Findings represent an interval change from priors. IMPRESSION: Suspected early BILATERAL pneumonia, worse on the LEFT. Electronically Signed   By: Staci Righter M.D.   On: 02/03/2017 09:05    Procedures Procedures (including critical care time)  Medications Ordered in ED Medications  ipratropium-albuterol (DUONEB) 0.5-2.5 (3) MG/3ML nebulizer solution 3 mL (3 mLs Nebulization Given 02/03/17 0943)  cefTRIAXone (ROCEPHIN) 1 g in dextrose 5 % 50 mL IVPB (1 g Intravenous New Bag/Given 02/03/17 0955)  azithromycin (ZITHROMAX) 500 mg in dextrose 5 % 250 mL IVPB (not administered)  sodium chloride 0.9 % bolus 500 mL (not administered)     Initial Impression / Assessment and Plan / ED Course  I have  reviewed the triage vital signs and the nursing notes.  Pertinent labs & imaging results that were available during my care of the patient were reviewed by me and considered in my medical decision making (see chart for details).  Final Clinical Impressions(s) / ED Diagnoses  {I have reviewed and evaluated the relevant laboratory values. {I have reviewed and evaluated the relevant imaging studies. {I have interpreted the relevant EKG. {I have reviewed the relevant previous healthcare records.  {I obtained HPI from historian. {Patient discussed with supervising physician.  ED Course:  Assessment: Pt is a 56 y.o. male with a hx of CAD, CHF, HTN, CKD Stage III, presents to the Emergency Department today due to cough/nasal congestion since Saturday. Notes subjective fevers. Endorses productive cough with sputum. No N/V/D. No CP/ABD pain. Notes mild shortness of breath. No sick contacts. Denies headaches or neck stiffness. Denies pain in general. OTC medications with minimal relief. On exam, pt in NAD. Nontoxic/nonseptic appearing. VS with increased RR. Afebrile. Lungs with bilateral wheeze. Heart RRR. Abdomen nontender soft. CXR with bilateral pneumonia. CBC unremarkable. BMP with elevated Creatinine. Appears baseline. iStat lactic negative. Noted recent hospitalization x 6 months ago for CAP. Given Rocephin/Azithro in ED. Duo neb given for wheeze. Given co morbidities and likely pt will fail outpatient therapy, will admit to medicine. Plan is to [facility].   Disposition/Plan:  Admit Pt acknowledges and agrees with plan  Supervising Physician Quintella Reichert, MD  Final diagnoses:  Multifocal pneumonia    ED Discharge Orders    None         Shary Decamp, PA-C 02/03/17 [place], [place], MD 02/04/17 [phone]

## 2017-02-03 NOTE — ED Notes (Signed)
Ambulatory to the bathroom for need to have a BM. Ambulatory without increased SOB or cough.

## 2017-02-04 ENCOUNTER — Other Ambulatory Visit (HOSPITAL_COMMUNITY): Payer: Medicare Other

## 2017-02-04 DIAGNOSIS — J181 Lobar pneumonia, unspecified organism: Secondary | ICD-10-CM

## 2017-02-04 DIAGNOSIS — I429 Cardiomyopathy, unspecified: Secondary | ICD-10-CM | POA: Diagnosis not present

## 2017-02-04 DIAGNOSIS — I2510 Atherosclerotic heart disease of native coronary artery without angina pectoris: Secondary | ICD-10-CM

## 2017-02-04 DIAGNOSIS — E876 Hypokalemia: Secondary | ICD-10-CM | POA: Diagnosis not present

## 2017-02-04 DIAGNOSIS — E119 Type 2 diabetes mellitus without complications: Secondary | ICD-10-CM

## 2017-02-04 DIAGNOSIS — M069 Rheumatoid arthritis, unspecified: Secondary | ICD-10-CM | POA: Diagnosis present

## 2017-02-04 DIAGNOSIS — N183 Chronic kidney disease, stage 3 (moderate): Secondary | ICD-10-CM | POA: Diagnosis not present

## 2017-02-04 LAB — BASIC METABOLIC PANEL
ANION GAP: 12 (ref 5–15)
BUN: 29 mg/dL — ABNORMAL HIGH (ref 6–20)
CHLORIDE: 108 mmol/L (ref 101–111)
CO2: 23 mmol/L (ref 22–32)
Calcium: 7.7 mg/dL — ABNORMAL LOW (ref 8.9–10.3)
Creatinine, Ser: 3.35 mg/dL — ABNORMAL HIGH (ref 0.61–1.24)
GFR calc Af Amer: 22 mL/min — ABNORMAL LOW (ref >60)
GFR, EST NON AFRICAN AMERICAN: 19 mL/min — AB (ref >60)
GLUCOSE: 173 mg/dL — AB (ref 65–99)
POTASSIUM: 3.2 mmol/L — AB (ref 3.5–5.1)
Sodium: 143 mmol/L (ref 135–145)

## 2017-02-04 LAB — RESPIRATORY PANEL BY PCR
ADENOVIRUS-RVPPCR: NOT DETECTED
BORDETELLA PERTUSSIS-RVPCR: NOT DETECTED
CHLAMYDOPHILA PNEUMONIAE-RVPPCR: NOT DETECTED
CORONAVIRUS 229E-RVPPCR: NOT DETECTED
CORONAVIRUS HKU1-RVPPCR: NOT DETECTED
CORONAVIRUS NL63-RVPPCR: NOT DETECTED
Coronavirus OC43: NOT DETECTED
Influenza A: NOT DETECTED
Influenza B: NOT DETECTED
MYCOPLASMA PNEUMONIAE-RVPPCR: NOT DETECTED
Metapneumovirus: NOT DETECTED
Parainfluenza Virus 1: NOT DETECTED
Parainfluenza Virus 2: NOT DETECTED
Parainfluenza Virus 3: NOT DETECTED
Parainfluenza Virus 4: NOT DETECTED
RHINOVIRUS / ENTEROVIRUS - RVPPCR: DETECTED — AB
Respiratory Syncytial Virus: NOT DETECTED

## 2017-02-04 LAB — EXPECTORATED SPUTUM ASSESSMENT W GRAM STAIN, RFLX TO RESP C

## 2017-02-04 LAB — EXPECTORATED SPUTUM ASSESSMENT W REFEX TO RESP CULTURE

## 2017-02-04 LAB — LIPID PANEL
CHOL/HDL RATIO: 5.4 ratio
Cholesterol: 182 mg/dL (ref 0–200)
HDL: 34 mg/dL — AB (ref >40)
LDL CALC: 99 mg/dL (ref 0–99)
Triglycerides: 244 mg/dL — ABNORMAL HIGH (ref <150)
VLDL: 49 mg/dL — AB (ref 0–40)

## 2017-02-04 LAB — HEMOGLOBIN A1C
HEMOGLOBIN A1C: 8.1 % — AB (ref 4.8–5.6)
Mean Plasma Glucose: 185.77 mg/dL

## 2017-02-04 LAB — GLUCOSE, CAPILLARY
GLUCOSE-CAPILLARY: 170 mg/dL — AB (ref 65–99)
Glucose-Capillary: 169 mg/dL — ABNORMAL HIGH (ref 65–99)

## 2017-02-04 MED ORDER — CEFDINIR 250 MG/5ML PO SUSR: 300.0000 mg | Freq: Every day | ORAL | 0 refills | Status: AC

## 2017-02-04 MED ORDER — POTASSIUM CHLORIDE CRYS ER 20 MEQ PO TBCR
40.0000 meq | EXTENDED_RELEASE_TABLET | Freq: Once | ORAL | Status: AC
  Administered 2017-02-04: 40 meq via ORAL
  Filled 2017-02-04: qty 2

## 2017-02-04 MED ORDER — DIGOXIN 250 MCG PO TABS: 0.1250 mg | ORAL_TABLET | ORAL | Status: AC

## 2017-02-04 MED ORDER — AZITHROMYCIN 250 MG PO TABS: 250.0000 mg | ORAL_TABLET | Freq: Every day | ORAL | 0 refills | Status: AC

## 2017-02-04 NOTE — Care Management Note (Signed)
Case Management Note  Patient Details  Name: Joshua Fernandez MRN: [id number] Date of Birth: 1961-03-08  Subjective/Objective: 56 y/o m admitted w/lobar pna. From home.                   Action/Plan:d/c home.   Expected Discharge Date:  (unknown)               Expected Discharge Plan:  Home/Self Care  In-House Referral:     Discharge planning Services  CM Consult  Post Acute Care Choice:    Choice offered to:     DME Arranged:    DME Agency:     HH Arranged:    [place] Agency:     Status of Service:  Completed, signed off  If discussed at [employer] of Stay Meetings, dates discussed:    Additional Comments:  Dessa Phi, RN 02/04/2017, 12:14 PM

## 2017-02-04 NOTE — Discharge Summary (Signed)
Physician Discharge Summary  Joshua Fernandez HWE:[id number] DOB: Aug 15, 1960 DOA: 02/03/2017  PCP: Wallene Dales, MD  Admit date: 02/03/2017 Discharge date: 02/04/2017  Admitted From: Home Disposition: Home   Recommendations for Outpatient Follow-up:  1. Follow up with PCP in 1-2 weeks  Home Health: None Equipment/Devices: None Discharge Condition: Stable CODE STATUS: Full Diet recommendation: Heart healthy, carb-modified  Brief/Interim Summary: Joshua Fernandez is a 56 y.o. male with a history of NIDT2DM, unmedicated RA, NICM, LV thrombus no longer on anticoagulation and stage III CKD who presented to the ED for cough, found to be tachypneic, wheezing, with lower extremity edema and CXR suggestive of bibasilar pneumonia. Antibiotics, duonebs, and IV lasix were administered and he was brought in for observation. He fortunately improved significantly and is discharged on antibiotics, appearing euvolemic.   Discharge Diagnoses:  Principal Problem:   Lobar pneumonia ([place]) Active Problems:   Community acquired pneumonia   Hypokalemia   CKD (chronic kidney disease), stage III ([place])   Cardiomyopathy ([place])   Rheumatoid arthritis ([place])   Diabetes mellitus without complication ([place])   Coronary artery disease  CAP: Predominantly LLL involvement. No evidence of pulmonary edema on CXR, appears euvolemic. Currently without fever, leukocytosis, hypoxia or chest pain.  - Continue third-gen cephalosporin (renally dosed) and azithromycin as below.  - Restarted po diuretic and other home medications. Appears euvolemic. No indication for echocardiogram at this time.   Other chronic medical conditions as above were stable.   Discharge Instructions Discharge Instructions    Diet - low sodium heart healthy   Complete by:  As directed    Discharge instructions   Complete by:  As directed    You were admitted for pneumonia which has improved. You are stable for discharge.  - Continue taking  azithromycin (for 4 more days) and omnicef (for 6 more days) once daily. - Follow up with your PCP in 1 - 2 weeks or return for care if your symptoms return/worsen.   Increase activity slowly   Complete by:  As directed      Allergies as of 02/04/2017   No Known Allergies     Medication List    TAKE these medications   albuterol 108 (90 Base) MCG/ACT inhaler Commonly known as:  PROVENTIL HFA;VENTOLIN HFA Inhale 2 puffs every 4 (four) hours into the lungs.   amLODipine 10 MG tablet Commonly known as:  NORVASC Take 10 mg daily by mouth.   atorvastatin 40 MG tablet Commonly known as:  LIPITOR Take 40 mg by mouth every morning.   azithromycin 250 MG tablet Commonly known as:  ZITHROMAX Take 1 tablet (250 mg total) daily by mouth.   carvedilol 25 MG tablet Commonly known as:  COREG Take 25 mg by mouth 2 (two) times daily with a meal.   cefdinir 250 MG/5ML suspension Commonly known as:  OMNICEF Take 6 mLs (300 mg total) daily for 6 days by mouth.   cholecalciferol 400 units Tabs tablet Commonly known as:  VITAMIN D Take 800 Units every morning by mouth.   digoxin 0.25 MG tablet Commonly known as:  LANOXIN Take 0.5 tablets (0.125 mg total) every morning by mouth. What changed:  how much to take   famotidine 10 MG tablet Commonly known as:  PEPCID Take 10 mg by mouth every morning.   glipiZIDE 10 MG 24 hr tablet Commonly known as:  GLUCOTROL XL Take 10 mg by mouth daily with breakfast.   guaiFENesin 600 MG 12 hr tablet Commonly known as:  MUCINEX Take 600 mg by mouth every morning.   latanoprost 0.005 % ophthalmic solution Commonly known as:  XALATAN Place 1 drop into both eyes at bedtime as needed (irritation).   lisinopril 40 MG tablet Commonly known as:  PRINIVIL,ZESTRIL Take 40 mg daily by mouth.   potassium chloride SA 20 MEQ tablet Commonly known as:  K-DUR,KLOR-CON Take 20 mEq by mouth every morning.   SYMBICORT 160-4.5 MCG/ACT inhaler Generic  drug:  budesonide-formoterol Inhale 2 puffs 2 (two) times daily into the lungs.      Follow-up Information    Wallene Dales, MD Follow up.   Specialty:  Family Medicine Contact information: [address] [place] [address] [phone]          No Known Allergies  Consultations:  None  Procedures/Studies: Dg Chest 2 View  Result Date: 02/03/2017 CLINICAL DATA:  Cough, nasal congestion, and facial pain. EXAM: CHEST  2 VIEW COMPARISON:  08/05/2016. FINDINGS: Suspected cardiac enlargement. Low lung volumes. BILATERAL perihilar and lower lobe opacities appear represent early consolidation. This is worse on the LEFT. There is no effusion or pneumothorax. Bones are unremarkable. Findings represent an interval change from priors. IMPRESSION: Suspected early BILATERAL pneumonia, worse on the LEFT. Electronically Signed   By: Staci Righter M.D.   On: 02/03/2017 09:05    Subjective: Feels well. "Let me go home." No dyspnea, chest pain, leg swelling, palpitations, or fever. Cough is stable, not too bothersome.   Discharge Exam: Vitals:   02/04/17 1002 02/04/17 1207  BP:    Pulse: 72   Resp:    Temp:    SpO2:  94%   General: Pt is alert, awake, not in acute distress Cardiovascular: RRR, S1/S2 +, no rubs, no gallops Respiratory: CTA bilaterally, no wheezing, no rhonchi Abdominal: Soft, NT, ND, bowel sounds + Extremities: No edema, no cyanosis  Labs: BNP (last 3 results) Recent Labs    07/10/16 0330  BNP 761.6*   Basic Metabolic Panel: Recent Labs  Lab 02/03/17 0947 02/03/17 1622 02/04/17 0515  NA 140  --  143  K 3.2*  --  3.2*  CL 110  --  108  CO2 23  --  23  GLUCOSE 156*  --  173*  BUN 30*  --  29*  CREATININE 3.64* 3.28* 3.35*  CALCIUM 7.8*  --  7.7*   Liver Function Tests: No results for input(s): AST, ALT, ALKPHOS, BILITOT, PROT, ALBUMIN in the last 168 hours. No results for input(s): LIPASE, AMYLASE in the last 168 hours. No  results for input(s): AMMONIA in the last 168 hours. CBC: Recent Labs  Lab 02/03/17 0947 02/03/17 1622  WBC 8.8 9.1  HGB 11.3* 11.2*  HCT 34.9* 34.9*  MCV 74.6* 75.4*  PLT 215 142*   Cardiac Enzymes: No results for input(s): CKTOTAL, CKMB, CKMBINDEX, TROPONINI in the last 168 hours. BNP: Invalid input(s): POCBNP CBG: Recent Labs  Lab 02/03/17 1635 02/03/17 2240 02/04/17 0804 02/04/17 1205  GLUCAP 126* 161* 169* 170*   D-Dimer No results for input(s): DDIMER in the last 72 hours. Hgb A1c Recent Labs    02/04/17 0515  HGBA1C 8.1*   Lipid Profile Recent Labs    02/04/17 0515  CHOL 182  HDL 34*  LDLCALC 99  TRIG 244*  CHOLHDL 5.4   Thyroid function studies No results for input(s): TSH, T4TOTAL, T3FREE, THYROIDAB in the last 72 hours.  Invalid input(s): FREET3 Anemia work up No results for input(s): VITAMINB12, FOLATE, FERRITIN,  TIBC, IRON, RETICCTPCT in the last 72 hours. Urinalysis    Component Value Date/Time   COLORURINE YELLOW 07/11/2016 0001   APPEARANCEUR CLOUDY (A) 07/11/2016 0001   LABSPEC 1.011 07/11/2016 0001   PHURINE 5.0 07/11/2016 0001   GLUCOSEU 50 (A) 07/11/2016 0001   HGBUR MODERATE (A) 07/11/2016 0001   BILIRUBINUR NEGATIVE 07/11/2016 0001   KETONESUR NEGATIVE 07/11/2016 0001   PROTEINUR >=300 (A) 07/11/2016 0001   NITRITE NEGATIVE 07/11/2016 0001   LEUKOCYTESUR NEGATIVE 07/11/2016 0001    Microbiology Recent Results (from the past 240 hour(s))  Culture, blood (routine x 2)     Status: None (Preliminary result)   Collection Time: 02/03/17  4:22 PM  Result Value Ref Range Status   Specimen Description BLOOD LEFT ARM  Final   Special Requests IN PEDIATRIC BOTTLE Blood Culture adequate volume  Final   Culture   Final    NO GROWTH < 24 HOURS Performed at [facility], [place] [address], [place], [place] [address]    Report Status PENDING  Incomplete  Culture, blood (routine x 2)     Status: None (Preliminary result)    Collection Time: 02/03/17  4:28 PM  Result Value Ref Range Status   Specimen Description BLOOD LEFT ARM  Final   Special Requests IN PEDIATRIC BOTTLE Blood Culture adequate volume  Final   Culture   Final    NO GROWTH < 24 HOURS Performed at [facility], [place] [address], [place], [place] [address]    Report Status PENDING  Incomplete  Culture, expectorated sputum-assessment     Status: None   Collection Time: 02/04/17 10:04 AM  Result Value Ref Range Status   Specimen Description SPUTUM  Final   Special Requests NONE  Final   Sputum evaluation THIS SPECIMEN IS ACCEPTABLE FOR SPUTUM CULTURE  Final   Report Status 02/04/2017 FINAL  Final  Culture, respiratory (NON-Expectorated)     Status: None (Preliminary result)   Collection Time: 02/04/17 10:04 AM  Result Value Ref Range Status   Specimen Description SPUTUM  Final   Special Requests NONE Reflexed from [id number]  Final   Gram Stain   Final    FEW WBC PRESENT,BOTH PMN AND MONONUCLEAR RARE GRAM POSITIVE COCCI RARE GRAM VARIABLE ROD Performed at [facility], [place] [address], [place], [place] [address]    Culture PENDING  Incomplete   Report Status PENDING  Incomplete    Time coordinating discharge: Approximately 40 minutes  Vance Gather, MD  [facility] 02/04/2017, 3:54 PM Pager [phone]

## 2017-02-04 NOTE — Care Management Obs Status (Signed)
[facility] NOTIFICATION   Patient Details  Name: Joshua Fernandez MRN: [id number] Date of Birth: 04-24-1960   Medicare Observation Status Notification Given:  Yes    MahabirJuliann Pulse, RN 02/04/2017, 11:58 AM

## 2017-02-07 LAB — CULTURE, RESPIRATORY W GRAM STAIN

## 2017-02-07 LAB — CULTURE, RESPIRATORY

## 2017-02-08 LAB — CULTURE, BLOOD (ROUTINE X 2)
CULTURE: NO GROWTH
Culture: NO GROWTH
SPECIAL REQUESTS: ADEQUATE
Special Requests: ADEQUATE

## 2017-04-22 ENCOUNTER — Emergency Department (HOSPITAL_BASED_OUTPATIENT_CLINIC_OR_DEPARTMENT_OTHER)
Admission: EM | Admit: 2017-04-22 | Discharge: 2017-04-22 | Disposition: A | Discharge status: Home / Self Care | Payer: Medicare HMO | Attending: Emergency Medicine | Admitting: Emergency Medicine

## 2017-04-22 ENCOUNTER — Emergency Department (HOSPITAL_BASED_OUTPATIENT_CLINIC_OR_DEPARTMENT_OTHER): Payer: Medicare HMO

## 2017-04-22 ENCOUNTER — Encounter (HOSPITAL_BASED_OUTPATIENT_CLINIC_OR_DEPARTMENT_OTHER): Payer: Self-pay

## 2017-04-22 ENCOUNTER — Other Ambulatory Visit: Payer: Self-pay

## 2017-04-22 DIAGNOSIS — Z79899 Other long term (current) drug therapy: Secondary | ICD-10-CM | POA: Insufficient documentation

## 2017-04-22 DIAGNOSIS — M25462 Effusion, left knee: Secondary | ICD-10-CM | POA: Diagnosis not present

## 2017-04-22 DIAGNOSIS — I2510 Atherosclerotic heart disease of native coronary artery without angina pectoris: Secondary | ICD-10-CM | POA: Diagnosis not present

## 2017-04-22 DIAGNOSIS — Z7984 Long term (current) use of oral hypoglycemic drugs: Secondary | ICD-10-CM | POA: Insufficient documentation

## 2017-04-22 DIAGNOSIS — E1122 Type 2 diabetes mellitus with diabetic chronic kidney disease: Secondary | ICD-10-CM | POA: Diagnosis not present

## 2017-04-22 DIAGNOSIS — I130 Hypertensive heart and chronic kidney disease with heart failure and stage 1 through stage 4 chronic kidney disease, or unspecified chronic kidney disease: Secondary | ICD-10-CM | POA: Diagnosis not present

## 2017-04-22 DIAGNOSIS — N183 Chronic kidney disease, stage 3 (moderate): Secondary | ICD-10-CM | POA: Insufficient documentation

## 2017-04-22 DIAGNOSIS — I509 Heart failure, unspecified: Secondary | ICD-10-CM | POA: Diagnosis not present

## 2017-04-22 DIAGNOSIS — M25562 Pain in left knee: Secondary | ICD-10-CM | POA: Diagnosis not present

## 2017-04-22 MED ORDER — ONDANSETRON 4 MG PO TBDP
ORAL_TABLET | ORAL | Status: AC
  Filled 2017-04-22: qty 1

## 2017-04-22 MED ORDER — OXYCODONE-ACETAMINOPHEN 5-325 MG PO TABS
1.0000 | ORAL_TABLET | Freq: Once | ORAL | Status: AC
  Administered 2017-04-22: 1 via ORAL
  Filled 2017-04-22: qty 1

## 2017-04-22 MED ORDER — ONDANSETRON HCL 4 MG/2ML IJ SOLN: 4.0000 mg | Freq: Once | INTRAMUSCULAR | Status: DC

## 2017-04-22 MED ORDER — OXYCODONE HCL 5 MG PO TABS: 2.5000 mg | ORAL_TABLET | ORAL | 0 refills | Status: DC | PRN

## 2017-04-22 MED ORDER — ONDANSETRON 4 MG PO TBDP
4.0000 mg | ORAL_TABLET | Freq: Once | ORAL | Status: AC
  Administered 2017-04-22: 4 mg via ORAL

## 2017-04-22 NOTE — ED Triage Notes (Signed)
C/o left knee pain x 4 days-denies injury-also c/o elevated BS-"180-200"-NAD-presents to triage in w/c

## 2017-04-22 NOTE — ED Notes (Signed)
Pt verbalizes understanding of d/c instructions and denies any further needs at this time. 

## 2017-04-22 NOTE — ED Provider Notes (Signed)
[facility] EMERGENCY DEPARTMENT Provider Note   CSN: [id number] Arrival date & time: 04/22/17  1227     History   Chief Complaint Chief Complaint  Patient presents with  . Knee Pain    HPI Joshua Fernandez is a 57 y.o. male.  Knee Pain: Patient complains of left knee pain.The pain began 4 days ago. The pain is located global.  He describes the symptoms as aching. Symptoms improve with rest, the use of a cane, and slight flexion in the knee. The symptoms are worse with activity, weight bearing, or movement of the uear. The knee has not given out or felt unstable. The patient can bend and straighten the knee with pain.. Treatment to date has been ice, Tylenol, without significant relief. Patient has a hx of gout and states that he does not think it is gout because it does not hurt nearly as bad. He denies fever chills or inablity to bear weight. He has noticed mild swelling, denies leg pain, swelling, calf tenderness or sob.      Past Medical History:  Diagnosis Date  . AKI (acute kidney injury) ([place])   . Cardiomyopathy ([place])   . Cataract   . CHF (congestive heart failure) ([place])   . Coronary artery disease   . Diabetes mellitus without complication ([place])   . Gout   . Hyperlipemia   . Hyperparathyroidism ([place])   . Hypertension   . Open-angle glaucoma   . Pneumonia   . Renal disorder    chronic kidney disease stage 3  . Rheumatoid arthritis ([place])   . 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 Vitamin D deficiency     Patient Active Problem List   Diagnosis Date Noted  . Cardiomyopathy ([place]) 02/04/2017  . Rheumatoid arthritis ([place]) 02/04/2017  . Diabetes mellitus without complication ([place]) 86/76/1949  . Coronary artery disease 02/04/2017  . Lobar pneumonia ([place]) 02/03/2017  . Community acquired pneumonia 07/12/2016  . Acute respiratory failure with hypoxia ([place]) 07/12/2016  . Hypokalemia 07/12/2016  . Hypomagnesemia 07/12/2016  . CKD  (chronic kidney disease), stage III ([place]) 07/12/2016  . Sepsis ([place]) 07/10/2016    Past Surgical History:  Procedure Laterality Date  . FRACTURE SURGERY         Home Medications    Prior to Admission medications   Medication Sig Start Date End Date Taking? Authorizing Provider  albuterol (PROVENTIL HFA;VENTOLIN HFA) 108 (90 Base) MCG/ACT inhaler Inhale 2 puffs every 4 (four) hours into the lungs. 07/18/16   [provider]  amLODipine (NORVASC) 10 MG tablet Take 10 mg daily by mouth. 11/01/16   [provider]  atorvastatin (LIPITOR) 40 MG tablet Take 40 mg by mouth every morning.     [provider]  azithromycin (ZITHROMAX) 250 MG tablet Take 1 tablet (250 mg total) daily by mouth. 02/04/17   Patrecia Pour, MD  budesonide-formoterol (SYMBICORT) 160-4.5 MCG/ACT inhaler Inhale 2 puffs 2 (two) times daily into the lungs. 07/18/16   [provider]  carvedilol (COREG) 25 MG tablet Take 25 mg by mouth 2 (two) times daily with a meal.    [provider]  cholecalciferol (VITAMIN D) 400 units TABS tablet Take 800 Units every morning by mouth.     [provider]  digoxin (LANOXIN) 0.25 MG tablet Take 0.5 tablets (0.125 mg total) every morning by mouth. 02/04/17   Patrecia Pour, MD  famotidine (PEPCID) 10 MG tablet Take 10 mg by  mouth every morning.    [provider]  glipiZIDE (GLUCOTROL XL) 10 MG 24 hr tablet Take 10 mg by mouth daily with breakfast.    [provider]  guaiFENesin (MUCINEX) 600 MG 12 hr tablet Take 600 mg by mouth every morning.    [provider]  latanoprost (XALATAN) 0.005 % ophthalmic solution Place 1 drop into both eyes at bedtime as needed (irritation).     [provider]  lisinopril (PRINIVIL,ZESTRIL) 40 MG tablet Take 40 mg daily by mouth. 12/20/16   [provider]  oxyCODONE (OXY IR/ROXICODONE) 5 MG immediate release tablet Take 0.5-1 tablets (2.5-5 mg total) by mouth  every 4 (four) hours as needed for severe pain. 04/22/17   Sadi Arave, Vernie Shanks, PA-C  potassium chloride SA (K-DUR,KLOR-CON) 20 MEQ tablet Take 20 mEq by mouth every morning.     [provider]    Family History No family history on file.  Social History Social History   Tobacco Use  . Smoking status: Never Smoker  . Smokeless tobacco: Never Used  Substance Use Topics  . Alcohol use: No  . Drug use: No     Allergies   Patient has no known allergies.   Review of Systems Review of Systems  Ten systems reviewed and are negative for acute change, except as noted in the HPI.   Physical Exam Updated Vital Signs BP 133/60 (BP Location: Right Arm)   Pulse 62   Temp 98.8 F (37.1 C) (Oral)   Resp 18   Ht 5\' 11"  (1.803 m)   Wt 124.7 kg (275 lb)   SpO2 95%   BMI 38.35 kg/m   Physical Exam  Constitutional: He is oriented to person, place, and time. He appears well-developed and well-nourished. No distress.  HENT:  Head: Normocephalic and atraumatic.  Eyes: Conjunctivae are normal. No scleral icterus.  Neck: Normal range of motion. Neck supple.  Cardiovascular: Normal rate, regular rhythm and normal heart sounds.  Pulmonary/Chest: Effort normal and breath sounds normal. No respiratory distress.  Abdominal: Soft. There is no tenderness.  Musculoskeletal:  Minimal swelling of the left knee.  No warmth.  Global tenderness and slight crepitus with flexion extension on the lateral side of the knee.  Ligaments are stable.  No effusion.  No palpable Baker's cyst.  Calf is soft and nontender  Neurological: He is alert and oriented to person, place, and time.  Skin: Skin is warm and dry. He is not diaphoretic.  Psychiatric: His behavior is normal.  Nursing note and vitals reviewed.    ED Treatments / Results  Labs (all labs ordered are listed, but only abnormal results are displayed) Labs Reviewed - No data to display  EKG  EKG Interpretation None        Radiology Dg Knee Complete 4 Views Left  Result Date: 04/22/2017 CLINICAL DATA:  Painful left knee with inability to bend it for the past 3 days. No report of injury. EXAM: LEFT KNEE - COMPLETE 4+ VIEW COMPARISON:  None in PACs FINDINGS: The bones are subjectively adequately mineralized. The joint spaces are well maintained. There is no acute fracture or dislocation. No joint effusion is observed. There is mild beaking of the tibial spines. Tiny spurs arise from the articular margins of the patella. IMPRESSION: There is no acute bony abnormality of the left knee. Very mild osteoarthritic spurring is present. Electronically Signed   By: David  Martinique M.D.   On: 04/22/2017 14:52    Procedures Procedures (including  critical care time)  Medications Ordered in ED Medications  oxyCODONE-acetaminophen (PERCOCET/ROXICET) 5-325 MG per tablet 1 tablet (1 tablet Oral Given 04/22/17 1435)  ondansetron (ZOFRAN-ODT) disintegrating tablet 4 mg (4 mg Oral Given 04/22/17 1435)     Initial Impression / Assessment and Plan / ED Course  I have reviewed the triage vital signs and the nursing notes.  Pertinent labs & imaging results that were available during my care of the patient were reviewed by me and considered in my medical decision making (see chart for details).    Patient with knee pain.  It is quite tender with extension and flexion and having significant difficulty ambulating on the knee.  I doubt septic arthritis.  Potential for gout although the patient doubts that diagnosis.  His x-ray is negative for any acute abnormalities.  No large effusion.  No known injury.  Patient be discharged to follow-up with sports medicine.  He is otherwise appropriate for discharge at this time  Final Clinical Impressions(s) / ED Diagnoses   Final diagnoses:  Acute pain of left knee    ED Discharge Orders        Ordered    oxyCODONE (OXY IR/ROXICODONE) 5 MG immediate release tablet  Every 4 hours PRN      04/22/17 1602       Margarita Mail, PA-C 04/22/17 1723    Hayden Rasmussen, MD 04/23/17 [phone]

## 2017-04-22 NOTE — ED Notes (Signed)
ED Provider at bedside. 

## 2017-04-22 NOTE — Discharge Instructions (Signed)
Contact a health care provider if: °Your knee pain continues, changes, or gets worse. °You have a fever along with knee pain. °Your knee buckles or locks up. °Your knee swells, and the swelling becomes worse. °Get help right away if: °Your knee feels warm to the touch. °You cannot move your knee. °You have severe pain in your knee. °You have chest pain. °You have trouble breathing. °

## 2017-04-24 ENCOUNTER — Ambulatory Visit: Payer: Medicare HMO | Admitting: Family Medicine

## 2017-04-24 ENCOUNTER — Encounter: Payer: Self-pay | Admitting: Family Medicine

## 2017-04-24 DIAGNOSIS — M25562 Pain in left knee: Secondary | ICD-10-CM

## 2017-04-24 MED ORDER — METHYLPREDNISOLONE ACETATE 40 MG/ML IJ SUSP
40.0000 mg | Freq: Once | INTRAMUSCULAR | Status: AC
  Administered 2017-04-24: 40 mg via INTRA_ARTICULAR

## 2017-04-24 NOTE — Patient Instructions (Signed)
Your knee pain and swelling are consistent with an acute gout flare. You were given a cortisone injection today. Ice the knee 15 minutes at a time 3-4 times a day. Elevate above the level of your heart as much as possible. Tylenol if needed for pain though the shot should take care of most of this. Topical biofreeze, salon pas up to 4 times a day can help with pain as well. Compression sleeve or ace wrap should help with support and the swelling also. Call me in a week if you're not improving as expected otherwise follow up in 1 month.

## 2017-04-26 ENCOUNTER — Encounter: Payer: Self-pay | Admitting: Family Medicine

## 2017-04-26 DIAGNOSIS — M25562 Pain in left knee: Secondary | ICD-10-CM | POA: Insufficient documentation

## 2017-04-26 DIAGNOSIS — I5022 Chronic systolic (congestive) heart failure: Secondary | ICD-10-CM | POA: Insufficient documentation

## 2017-04-26 NOTE — Progress Notes (Signed)
PCP: Wallene Dales, MD  Subjective:   HPI: Patient is a 57 y.o. male here for left knee pain.  Patient denies known trauma or injury. He states about 5 days ago he woke up with severe anterior and posterior left knee pain. Associated swelling. Difficulty straightening knee out. No prior issues with this knee. Couldn't bear weight. Pain level 8/10 and sharp now. Taking pain medication from ED. Has history of gout and rheumatoid arthritis.  Past Medical History:  Diagnosis Date  . AKI (acute kidney injury) ([place])   . Cardiomyopathy ([place])   . Cataract   . CHF (congestive heart failure) ([place])   . Coronary artery disease   . Diabetes mellitus without complication ([place])   . Gout   . Hyperlipemia   . Hyperparathyroidism ([place])   . Hypertension   . Open-angle glaucoma   . Pneumonia   . Renal disorder    chronic kidney disease stage 3  . Rheumatoid arthritis ([place])   . 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 Vitamin D deficiency     Current Outpatient Medications on File Prior to Visit  Medication Sig Dispense Refill  . albuterol (PROVENTIL HFA;VENTOLIN HFA) 108 (90 Base) MCG/ACT inhaler Inhale 2 puffs every 4 (four) hours into the lungs.    Marland Kitchen amLODipine (NORVASC) 10 MG tablet Take 10 mg daily by mouth.    Marland Kitchen atorvastatin (LIPITOR) 40 MG tablet Take 40 mg by mouth every morning.     Marland Kitchen azithromycin (ZITHROMAX) 250 MG tablet Take 1 tablet (250 mg total) daily by mouth. 4 each 0  . budesonide-formoterol (SYMBICORT) 160-4.5 MCG/ACT inhaler Inhale 2 puffs 2 (two) times daily into the lungs.    . carvedilol (COREG) 25 MG tablet Take 25 mg by mouth 2 (two) times daily with a meal.    . cholecalciferol (VITAMIN D) 400 units TABS tablet Take 800 Units every morning by mouth.     . digoxin (LANOXIN) 0.25 MG tablet Take 0.5 tablets (0.125 mg total) every morning by mouth.    . famotidine (PEPCID) 10 MG tablet Take 10 mg by mouth every morning.    Marland Kitchen glipiZIDE (GLUCOTROL  XL) 10 MG 24 hr tablet Take 10 mg by mouth daily with breakfast.    . guaiFENesin (MUCINEX) 600 MG 12 hr tablet Take 600 mg by mouth every morning.    . latanoprost (XALATAN) 0.005 % ophthalmic solution Place 1 drop into both eyes at bedtime as needed (irritation).     Marland Kitchen lisinopril (PRINIVIL,ZESTRIL) 40 MG tablet Take 40 mg daily by mouth.    . oxyCODONE (OXY IR/ROXICODONE) 5 MG immediate release tablet Take 0.5-1 tablets (2.5-5 mg total) by mouth every 4 (four) hours as needed for severe pain. 12 tablet 0  . potassium chloride SA (K-DUR,KLOR-CON) 20 MEQ tablet Take 20 mEq by mouth every morning.      No current facility-administered medications on file prior to visit.     Past Surgical History:  Procedure Laterality Date  . FRACTURE SURGERY      No Known Allergies  Social History   Socioeconomic History  . Marital status: Married    Spouse name: Not on file  . Number of children: Not on file  . Years of education: Not on file  . Highest education level: Not on file  Social Needs  . Financial resource strain: Not on file  . Food insecurity - worry: Not on file  . Food insecurity - inability: Not on  file  . Transportation needs - medical: Not on file  . Transportation needs - non-medical: Not on file  Occupational History  . Not on file  Tobacco Use  . Smoking status: Never Smoker  . Smokeless tobacco: Never Used  Substance and Sexual Activity  . Alcohol use: No  . Drug use: No  . Sexual activity: Not on file  Other Topics Concern  . Not on file  Social History Narrative  . Not on file    History reviewed. No pertinent family history.  BP (!) 143/72   Pulse 69   Ht 5\' 11"  (1.803 m)   Wt 275 lb (124.7 kg)   BMI 38.35 kg/m   Review of Systems: See HPI above.     Objective:  Physical Exam:  Gen: NAD, comfortable in exam room  Left knee: Mild effusion.  No bruising, other deformity.  Mild warmth. Diffuse anterior tenderness.  Mild tenderness popliteal  fossa. ROM 0 - 90 degrees but pain getting to 0 degrees. Negative ant/post drawers. Negative valgus/varus testing. Negative lachmanns. Negative mcmurrays, apleys, patellar apprehension. NV intact distally.  Right knee: No deformity. FROM with 5/5 strength. No tenderness to palpation. NVI distally.   MSK u/s left knee:  Very small bakers cyst.  Small effusion.  Visible venous structures proximal calf and popliteal fossa are compressible.  Assessment & Plan:  1. Left knee pain - history and exam consistent with acute gout flare.  Injection given today.  Icing, elevation, tylenol topical medications.  Compression.  F/u in 1 month if doing well.  After informed written consent timeout was performed, patient was seated on exam table. Left knee was prepped with alcohol swab and utilizing anteromedial approach, patient's left knee was injected intraarticularly with 3:1 bupivicaine: depomedrol. Patient tolerated the procedure well without immediate complications.

## 2017-04-26 NOTE — Assessment & Plan Note (Signed)
history and exam consistent with acute gout flare.  Injection given today.  Icing, elevation, tylenol topical medications.  Compression.  F/u in 1 month if doing well.  After informed written consent timeout was performed, patient was seated on exam table. Left knee was prepped with alcohol swab and utilizing anteromedial approach, patient's left knee was injected intraarticularly with 3:1 bupivicaine: depomedrol. Patient tolerated the procedure well without immediate complications.

## 2017-05-02 DIAGNOSIS — N184 Chronic kidney disease, stage 4 (severe): Secondary | ICD-10-CM | POA: Diagnosis not present

## 2017-05-06 DIAGNOSIS — N184 Chronic kidney disease, stage 4 (severe): Secondary | ICD-10-CM | POA: Diagnosis not present

## 2017-05-06 DIAGNOSIS — E876 Hypokalemia: Secondary | ICD-10-CM | POA: Diagnosis not present

## 2017-05-06 DIAGNOSIS — I129 Hypertensive chronic kidney disease with stage 1 through stage 4 chronic kidney disease, or unspecified chronic kidney disease: Secondary | ICD-10-CM | POA: Diagnosis not present

## 2017-05-06 DIAGNOSIS — E1122 Type 2 diabetes mellitus with diabetic chronic kidney disease: Secondary | ICD-10-CM | POA: Diagnosis not present

## 2017-05-06 DIAGNOSIS — N2581 Secondary hyperparathyroidism of renal origin: Secondary | ICD-10-CM | POA: Diagnosis not present

## 2017-07-02 DIAGNOSIS — E559 Vitamin D deficiency, unspecified: Secondary | ICD-10-CM | POA: Diagnosis not present

## 2017-07-02 DIAGNOSIS — E1122 Type 2 diabetes mellitus with diabetic chronic kidney disease: Secondary | ICD-10-CM | POA: Diagnosis not present

## 2017-07-02 DIAGNOSIS — R809 Proteinuria, unspecified: Secondary | ICD-10-CM | POA: Diagnosis not present

## 2017-07-02 DIAGNOSIS — N2581 Secondary hyperparathyroidism of renal origin: Secondary | ICD-10-CM | POA: Diagnosis not present

## 2017-07-02 DIAGNOSIS — N184 Chronic kidney disease, stage 4 (severe): Secondary | ICD-10-CM | POA: Diagnosis not present

## 2017-07-02 DIAGNOSIS — E876 Hypokalemia: Secondary | ICD-10-CM | POA: Diagnosis not present

## 2017-07-02 DIAGNOSIS — I129 Hypertensive chronic kidney disease with stage 1 through stage 4 chronic kidney disease, or unspecified chronic kidney disease: Secondary | ICD-10-CM | POA: Diagnosis not present

## 2017-08-04 DIAGNOSIS — N184 Chronic kidney disease, stage 4 (severe): Secondary | ICD-10-CM | POA: Diagnosis not present

## 2017-08-06 DIAGNOSIS — N184 Chronic kidney disease, stage 4 (severe): Secondary | ICD-10-CM | POA: Diagnosis not present

## 2017-08-06 DIAGNOSIS — I129 Hypertensive chronic kidney disease with stage 1 through stage 4 chronic kidney disease, or unspecified chronic kidney disease: Secondary | ICD-10-CM | POA: Diagnosis not present

## 2017-08-06 DIAGNOSIS — N2581 Secondary hyperparathyroidism of renal origin: Secondary | ICD-10-CM | POA: Diagnosis not present

## 2017-08-06 DIAGNOSIS — E876 Hypokalemia: Secondary | ICD-10-CM | POA: Diagnosis not present

## 2017-08-06 DIAGNOSIS — E1122 Type 2 diabetes mellitus with diabetic chronic kidney disease: Secondary | ICD-10-CM | POA: Diagnosis not present

## 2017-08-06 DIAGNOSIS — N179 Acute kidney failure, unspecified: Secondary | ICD-10-CM | POA: Diagnosis not present

## 2017-08-18 DIAGNOSIS — N179 Acute kidney failure, unspecified: Secondary | ICD-10-CM | POA: Diagnosis not present

## 2017-08-20 DIAGNOSIS — E876 Hypokalemia: Secondary | ICD-10-CM | POA: Diagnosis not present

## 2017-08-20 DIAGNOSIS — I129 Hypertensive chronic kidney disease with stage 1 through stage 4 chronic kidney disease, or unspecified chronic kidney disease: Secondary | ICD-10-CM | POA: Diagnosis not present

## 2017-08-20 DIAGNOSIS — N184 Chronic kidney disease, stage 4 (severe): Secondary | ICD-10-CM | POA: Diagnosis not present

## 2017-08-20 DIAGNOSIS — N179 Acute kidney failure, unspecified: Secondary | ICD-10-CM | POA: Diagnosis not present

## 2017-08-20 DIAGNOSIS — E1122 Type 2 diabetes mellitus with diabetic chronic kidney disease: Secondary | ICD-10-CM | POA: Diagnosis not present

## 2017-08-20 DIAGNOSIS — N2581 Secondary hyperparathyroidism of renal origin: Secondary | ICD-10-CM | POA: Diagnosis not present

## 2017-08-20 DIAGNOSIS — E669 Obesity, unspecified: Secondary | ICD-10-CM | POA: Diagnosis not present

## 2017-08-26 DIAGNOSIS — I429 Cardiomyopathy, unspecified: Secondary | ICD-10-CM | POA: Diagnosis not present

## 2017-08-26 DIAGNOSIS — I5022 Chronic systolic (congestive) heart failure: Secondary | ICD-10-CM | POA: Diagnosis not present

## 2017-08-26 DIAGNOSIS — N184 Chronic kidney disease, stage 4 (severe): Secondary | ICD-10-CM | POA: Diagnosis not present

## 2017-08-26 DIAGNOSIS — I2510 Atherosclerotic heart disease of native coronary artery without angina pectoris: Secondary | ICD-10-CM | POA: Diagnosis not present

## 2017-08-26 DIAGNOSIS — E1122 Type 2 diabetes mellitus with diabetic chronic kidney disease: Secondary | ICD-10-CM | POA: Diagnosis not present

## 2017-08-26 DIAGNOSIS — I129 Hypertensive chronic kidney disease with stage 1 through stage 4 chronic kidney disease, or unspecified chronic kidney disease: Secondary | ICD-10-CM | POA: Diagnosis not present

## 2017-08-26 DIAGNOSIS — N183 Chronic kidney disease, stage 3 (moderate): Secondary | ICD-10-CM | POA: Diagnosis not present

## 2017-08-26 DIAGNOSIS — I130 Hypertensive heart and chronic kidney disease with heart failure and stage 1 through stage 4 chronic kidney disease, or unspecified chronic kidney disease: Secondary | ICD-10-CM | POA: Diagnosis not present

## 2017-08-26 DIAGNOSIS — I2583 Coronary atherosclerosis due to lipid rich plaque: Secondary | ICD-10-CM | POA: Diagnosis not present

## 2017-08-26 DIAGNOSIS — E0822 Diabetes mellitus due to underlying condition with diabetic chronic kidney disease: Secondary | ICD-10-CM | POA: Diagnosis not present

## 2017-09-02 DIAGNOSIS — R899 Unspecified abnormal finding in specimens from other organs, systems and tissues: Secondary | ICD-10-CM | POA: Diagnosis not present

## 2017-09-29 DIAGNOSIS — N184 Chronic kidney disease, stage 4 (severe): Secondary | ICD-10-CM | POA: Diagnosis not present

## 2017-09-29 DIAGNOSIS — N179 Acute kidney failure, unspecified: Secondary | ICD-10-CM | POA: Diagnosis not present

## 2017-09-29 DIAGNOSIS — N2581 Secondary hyperparathyroidism of renal origin: Secondary | ICD-10-CM | POA: Diagnosis not present

## 2017-10-01 DIAGNOSIS — N185 Chronic kidney disease, stage 5: Secondary | ICD-10-CM | POA: Diagnosis not present

## 2017-10-01 DIAGNOSIS — E559 Vitamin D deficiency, unspecified: Secondary | ICD-10-CM | POA: Diagnosis not present

## 2017-10-01 DIAGNOSIS — E1122 Type 2 diabetes mellitus with diabetic chronic kidney disease: Secondary | ICD-10-CM | POA: Diagnosis not present

## 2017-10-01 DIAGNOSIS — D509 Iron deficiency anemia, unspecified: Secondary | ICD-10-CM | POA: Diagnosis not present

## 2017-10-01 DIAGNOSIS — N2581 Secondary hyperparathyroidism of renal origin: Secondary | ICD-10-CM | POA: Diagnosis not present

## 2017-10-01 DIAGNOSIS — I120 Hypertensive chronic kidney disease with stage 5 chronic kidney disease or end stage renal disease: Secondary | ICD-10-CM | POA: Diagnosis not present

## 2017-10-01 DIAGNOSIS — E876 Hypokalemia: Secondary | ICD-10-CM | POA: Diagnosis not present

## 2017-10-10 DIAGNOSIS — I129 Hypertensive chronic kidney disease with stage 1 through stage 4 chronic kidney disease, or unspecified chronic kidney disease: Secondary | ICD-10-CM | POA: Diagnosis not present

## 2017-10-10 DIAGNOSIS — E119 Type 2 diabetes mellitus without complications: Secondary | ICD-10-CM | POA: Diagnosis not present

## 2017-10-10 DIAGNOSIS — H25013 Cortical age-related cataract, bilateral: Secondary | ICD-10-CM | POA: Diagnosis not present

## 2017-10-10 DIAGNOSIS — Z79899 Other long term (current) drug therapy: Secondary | ICD-10-CM | POA: Diagnosis not present

## 2017-10-10 DIAGNOSIS — H25043 Posterior subcapsular polar age-related cataract, bilateral: Secondary | ICD-10-CM | POA: Diagnosis not present

## 2017-10-10 DIAGNOSIS — N184 Chronic kidney disease, stage 4 (severe): Secondary | ICD-10-CM | POA: Diagnosis not present

## 2017-10-10 DIAGNOSIS — H401132 Primary open-angle glaucoma, bilateral, moderate stage: Secondary | ICD-10-CM | POA: Diagnosis not present

## 2017-10-10 DIAGNOSIS — Z7984 Long term (current) use of oral hypoglycemic drugs: Secondary | ICD-10-CM | POA: Diagnosis not present

## 2017-10-23 DIAGNOSIS — N186 End stage renal disease: Secondary | ICD-10-CM | POA: Diagnosis not present

## 2017-10-23 DIAGNOSIS — N185 Chronic kidney disease, stage 5: Secondary | ICD-10-CM | POA: Diagnosis not present

## 2017-10-23 DIAGNOSIS — Z992 Dependence on renal dialysis: Secondary | ICD-10-CM | POA: Diagnosis not present

## 2017-11-03 DIAGNOSIS — D509 Iron deficiency anemia, unspecified: Secondary | ICD-10-CM | POA: Diagnosis not present

## 2017-11-03 DIAGNOSIS — N185 Chronic kidney disease, stage 5: Secondary | ICD-10-CM | POA: Diagnosis not present

## 2017-11-03 DIAGNOSIS — N2581 Secondary hyperparathyroidism of renal origin: Secondary | ICD-10-CM | POA: Diagnosis not present

## 2017-11-04 DIAGNOSIS — D509 Iron deficiency anemia, unspecified: Secondary | ICD-10-CM | POA: Diagnosis not present

## 2017-11-04 DIAGNOSIS — N2581 Secondary hyperparathyroidism of renal origin: Secondary | ICD-10-CM | POA: Diagnosis not present

## 2017-11-04 DIAGNOSIS — N185 Chronic kidney disease, stage 5: Secondary | ICD-10-CM | POA: Diagnosis not present

## 2017-11-04 DIAGNOSIS — I120 Hypertensive chronic kidney disease with stage 5 chronic kidney disease or end stage renal disease: Secondary | ICD-10-CM | POA: Diagnosis not present

## 2017-11-04 DIAGNOSIS — E876 Hypokalemia: Secondary | ICD-10-CM | POA: Diagnosis not present

## 2017-11-04 DIAGNOSIS — E1122 Type 2 diabetes mellitus with diabetic chronic kidney disease: Secondary | ICD-10-CM | POA: Diagnosis not present

## 2017-11-14 DIAGNOSIS — N185 Chronic kidney disease, stage 5: Secondary | ICD-10-CM | POA: Diagnosis not present

## 2017-11-14 DIAGNOSIS — D631 Anemia in chronic kidney disease: Secondary | ICD-10-CM | POA: Diagnosis not present

## 2017-11-27 DIAGNOSIS — N185 Chronic kidney disease, stage 5: Secondary | ICD-10-CM | POA: Diagnosis not present

## 2017-11-27 DIAGNOSIS — I120 Hypertensive chronic kidney disease with stage 5 chronic kidney disease or end stage renal disease: Secondary | ICD-10-CM | POA: Diagnosis not present

## 2017-11-27 DIAGNOSIS — E1122 Type 2 diabetes mellitus with diabetic chronic kidney disease: Secondary | ICD-10-CM | POA: Diagnosis not present

## 2017-11-27 DIAGNOSIS — R001 Bradycardia, unspecified: Secondary | ICD-10-CM | POA: Diagnosis not present

## 2017-11-27 DIAGNOSIS — I440 Atrioventricular block, first degree: Secondary | ICD-10-CM | POA: Diagnosis not present

## 2017-11-27 DIAGNOSIS — Z01818 Encounter for other preprocedural examination: Secondary | ICD-10-CM | POA: Diagnosis not present

## 2017-11-27 DIAGNOSIS — R9431 Abnormal electrocardiogram [ECG] [EKG]: Secondary | ICD-10-CM | POA: Diagnosis not present

## 2017-12-01 ENCOUNTER — Encounter (HOSPITAL_BASED_OUTPATIENT_CLINIC_OR_DEPARTMENT_OTHER): Payer: Self-pay | Admitting: *Deleted

## 2017-12-01 ENCOUNTER — Emergency Department (HOSPITAL_BASED_OUTPATIENT_CLINIC_OR_DEPARTMENT_OTHER)
Admission: EM | Admit: 2017-12-01 | Discharge: 2017-12-01 | Disposition: A | Discharge status: Home / Self Care | Payer: Medicare HMO | Attending: Emergency Medicine | Admitting: Emergency Medicine

## 2017-12-01 ENCOUNTER — Other Ambulatory Visit: Payer: Self-pay

## 2017-12-01 DIAGNOSIS — N289 Disorder of kidney and ureter, unspecified: Secondary | ICD-10-CM | POA: Diagnosis not present

## 2017-12-01 DIAGNOSIS — E1122 Type 2 diabetes mellitus with diabetic chronic kidney disease: Secondary | ICD-10-CM | POA: Insufficient documentation

## 2017-12-01 DIAGNOSIS — M109 Gout, unspecified: Secondary | ICD-10-CM | POA: Diagnosis not present

## 2017-12-01 DIAGNOSIS — Z7984 Long term (current) use of oral hypoglycemic drugs: Secondary | ICD-10-CM | POA: Diagnosis not present

## 2017-12-01 DIAGNOSIS — I2510 Atherosclerotic heart disease of native coronary artery without angina pectoris: Secondary | ICD-10-CM | POA: Insufficient documentation

## 2017-12-01 DIAGNOSIS — M25571 Pain in right ankle and joints of right foot: Secondary | ICD-10-CM | POA: Diagnosis not present

## 2017-12-01 DIAGNOSIS — Z79899 Other long term (current) drug therapy: Secondary | ICD-10-CM | POA: Insufficient documentation

## 2017-12-01 DIAGNOSIS — I130 Hypertensive heart and chronic kidney disease with heart failure and stage 1 through stage 4 chronic kidney disease, or unspecified chronic kidney disease: Secondary | ICD-10-CM | POA: Insufficient documentation

## 2017-12-01 DIAGNOSIS — N183 Chronic kidney disease, stage 3 (moderate): Secondary | ICD-10-CM | POA: Diagnosis not present

## 2017-12-01 DIAGNOSIS — M10372 Gout due to renal impairment, left ankle and foot: Secondary | ICD-10-CM

## 2017-12-01 DIAGNOSIS — M25572 Pain in left ankle and joints of left foot: Secondary | ICD-10-CM | POA: Insufficient documentation

## 2017-12-01 DIAGNOSIS — I5022 Chronic systolic (congestive) heart failure: Secondary | ICD-10-CM | POA: Diagnosis not present

## 2017-12-01 MED ORDER — OXYCODONE HCL 5 MG PO TABS: 5.0000 mg | ORAL_TABLET | ORAL | 0 refills | Status: AC | PRN

## 2017-12-01 MED ORDER — COLCHICINE 0.6 MG PO TABS: ORAL_TABLET | ORAL | 0 refills | Status: AC

## 2017-12-01 MED ORDER — PREDNISONE 20 MG PO TABS: ORAL_TABLET | ORAL | 0 refills | Status: DC

## 2017-12-01 MED ORDER — OXYCODONE HCL 5 MG PO TABS
5.0000 mg | ORAL_TABLET | Freq: Once | ORAL | Status: AC
  Administered 2017-12-01: 5 mg via ORAL
  Filled 2017-12-01: qty 1

## 2017-12-01 MED ORDER — COLCHICINE 0.6 MG PO TABS
1.2000 mg | ORAL_TABLET | Freq: Once | ORAL | Status: AC
  Administered 2017-12-01: 1.2 mg via ORAL
  Filled 2017-12-01: qty 2

## 2017-12-01 MED ORDER — PREDNISONE 50 MG PO TABS
60.0000 mg | ORAL_TABLET | Freq: Once | ORAL | Status: AC
  Administered 2017-12-01: 60 mg via ORAL
  Filled 2017-12-01: qty 1

## 2017-12-01 NOTE — ED Provider Notes (Signed)
[facility] EMERGENCY DEPARTMENT Provider Note   CSN: [id number] Arrival date & time: 12/01/17  0750     History   Chief Complaint Chief Complaint  Patient presents with  . Gout    HPI Joshua Fernandez is a 57 y.o. male.  57 yo M with a chief complaint of bilateral ankle pain.  Worse on the left than the right.  Denies trauma.  Feels like his prior gout.  Even the sheets or his socks make the pain significantly worse.  Has had gout in both joints previously.  Never at the same time.  Denies medical noncompliance.  The history is provided by the patient.  Ankle Pain   The incident occurred yesterday. The incident occurred at home. There was no injury mechanism. The pain is present in the left ankle and right ankle. The quality of the pain is described as sharp and throbbing. The pain is at a severity of 10/10. The pain is severe. The pain has been constant since onset. Pertinent negatives include no numbness and no inability to bear weight. He reports no foreign bodies present. Nothing aggravates the symptoms. He has tried nothing for the symptoms. The treatment provided no relief.    Past Medical History:  Diagnosis Date  . AKI (acute kidney injury) ([place])   . Cardiomyopathy ([place])   . Cataract   . CHF (congestive heart failure) ([place])   . Coronary artery disease   . Diabetes mellitus without complication ([place])   . Gout   . Hyperlipemia   . Hyperparathyroidism ([place])   . Hypertension   . Open-angle glaucoma   . Pneumonia   . Renal disorder    chronic kidney disease stage 3  . Rheumatoid arthritis ([place])   . 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 Vitamin D deficiency     Patient Active Problem List   Diagnosis Date Noted  . Chronic systolic congestive heart failure ([place]) 04/26/2017  . Left knee pain 04/26/2017  . Cardiomyopathy ([place]) 02/04/2017  . Rheumatoid arthritis ([place]) 02/04/2017  . Diabetes mellitus without complication ([place])  46/65/9935  . Coronary artery disease 02/04/2017  . Acute respiratory failure with hypoxia ([place]) 07/12/2016  . Hypokalemia 07/12/2016  . Hypomagnesemia 07/12/2016  . CKD (chronic kidney disease), stage III ([place]) 07/12/2016  . Sepsis ([place]) 07/10/2016  . Long term current use of anticoagulant therapy 09/08/2015  . Essential hypertension 06/27/2015  . Chronic gout of multiple sites 06/27/2015  . Hyperlipemia 03/31/2015    Past Surgical History:  Procedure Laterality Date  . FRACTURE SURGERY          Home Medications    Prior to Admission medications   Medication Sig Start Date End Date Taking? Authorizing Provider  albuterol (PROVENTIL HFA;VENTOLIN HFA) 108 (90 Base) MCG/ACT inhaler Inhale 2 puffs every 4 (four) hours into the lungs. 07/18/16   [provider]  amLODipine (NORVASC) 10 MG tablet Take 10 mg daily by mouth. 11/01/16   [provider]  atorvastatin (LIPITOR) 40 MG tablet Take 40 mg by mouth every morning.     [provider]  azithromycin (ZITHROMAX) 250 MG tablet Take 1 tablet (250 mg total) daily by mouth. 02/04/17   Patrecia Pour, MD  budesonide-formoterol (SYMBICORT) 160-4.5 MCG/ACT inhaler Inhale 2 puffs 2 (two) times daily into the lungs. 07/18/16   [provider]  carvedilol (COREG) 25 MG tablet Take 25 mg by mouth 2 (two) times daily with a meal.  [provider]  cholecalciferol (VITAMIN D) 400 units TABS tablet Take 800 Units every morning by mouth.     [provider]  colchicine 0.6 MG tablet Take one tablet an hour after getting your first dose here 12/01/17   Deno Etienne, DO  digoxin (LANOXIN) 0.25 MG tablet Take 0.5 tablets (0.125 mg total) every morning by mouth. 02/04/17   Patrecia Pour, MD  famotidine (PEPCID) 10 MG tablet Take 10 mg by mouth every morning.    [provider]  glipiZIDE (GLUCOTROL XL) 10 MG 24 hr tablet Take 10 mg by mouth daily with breakfast.    [provider]    guaiFENesin (MUCINEX) 600 MG 12 hr tablet Take 600 mg by mouth every morning.    [provider]  latanoprost (XALATAN) 0.005 % ophthalmic solution Place 1 drop into both eyes at bedtime as needed (irritation).     [provider]  lisinopril (PRINIVIL,ZESTRIL) 40 MG tablet Take 40 mg daily by mouth. 12/20/16   [provider]  oxyCODONE (ROXICODONE) 5 MG immediate release tablet Take 1 tablet (5 mg total) by mouth every 4 (four) hours as needed for severe pain. 12/01/17   Deno Etienne, DO  potassium chloride SA (K-DUR,KLOR-CON) 20 MEQ tablet Take 20 mEq by mouth every morning.     [provider]  predniSONE (DELTASONE) 20 MG tablet 2 tabs po daily x 4 days 12/01/17   Deno Etienne, DO    Family History History reviewed. No pertinent family history.  Social History Social History   Tobacco Use  . Smoking status: Never Smoker  . Smokeless tobacco: Never Used  Substance Use Topics  . Alcohol use: No  . Drug use: No     Allergies   Patient has no known allergies.   Review of Systems Review of Systems  Constitutional: Negative for chills and fever.  HENT: Negative for congestion and facial swelling.   Eyes: Negative for discharge and visual disturbance.  Respiratory: Negative for shortness of breath.   Cardiovascular: Negative for chest pain and palpitations.  Gastrointestinal: Negative for abdominal pain, diarrhea and vomiting.  Musculoskeletal: Positive for arthralgias and gait problem. Negative for myalgias.  Skin: Negative for color change and rash.  Neurological: Negative for tremors, syncope, numbness and headaches.  Psychiatric/Behavioral: Negative for confusion and dysphoric mood.     Physical Exam Updated Vital Signs BP (!) 138/59 (BP Location: Left Arm)   Pulse 73   Temp 98.3 F (36.8 C) (Oral)   Resp 18   Ht 5\' 11"  (1.803 m)   Wt 122.5 kg   SpO2 96%   BMI 37.66 kg/m   Physical Exam  Constitutional: He is oriented to person,  place, and time. He appears well-developed and well-nourished.  HENT:  Head: Normocephalic and atraumatic.  Eyes: Pupils are equal, round, and reactive to light. EOM are normal.  Neck: Normal range of motion. Neck supple. No JVD present.  Cardiovascular: Normal rate and regular rhythm. Exam reveals no gallop and no friction rub.  No murmur heard. Pulmonary/Chest: No respiratory distress. He has no wheezes.  Abdominal: He exhibits no distension. There is no rebound and no guarding.  Musculoskeletal: Normal range of motion.  Pain out of proportion to palpation of both ankles.  Not warm not especially swollen.  Intact pulse motor and sensation distally.  Neurological: He is alert and oriented to person, place, and time.  Skin: No rash noted. No pallor.  Psychiatric: He has a normal mood  and affect. His behavior is normal.  Nursing note and vitals reviewed.    ED Treatments / Results  Labs (all labs ordered are listed, but only abnormal results are displayed) Labs Reviewed - No data to display  EKG None  Radiology No results found.  Procedures Procedures (including critical care time)  Medications Ordered in ED Medications  colchicine tablet 1.2 mg (1.2 mg Oral Given 12/01/17 0832)  oxyCODONE (Oxy IR/ROXICODONE) immediate release tablet 5 mg (5 mg Oral Given 12/01/17 0832)  predniSONE (DELTASONE) tablet 60 mg (60 mg Oral Given 12/01/17 3785)     Initial Impression / Assessment and Plan / ED Course  I have reviewed the triage vital signs and the nursing notes.  Pertinent labs & imaging results that were available during my care of the patient were reviewed by me and considered in my medical decision making (see chart for details).     57 yo M with a significant past medical history of gout comes in with a chief complaint of a suspected gout flare.  Seems to fit clinically with pain out of proportion to joints that he has had gout in before.  He is never had it in multiple sites  the same time.  I feel this is unlikely to be septic arthritis.  We will treat his gout.  The patient is beginning preparations to start dialysis.  We will do the 2 days treatment of colchicine as well as a burst dose of steroids.  8:33 AM:  I have discussed the diagnosis/risks/treatment options with the patient and family and believe the pt to be eligible for discharge home to follow-up with PCP. We also discussed returning to the ED immediately if new or worsening sx occur. We discussed the sx which are most concerning (e.g., sudden worsening pain, fever, inability to tolerate by mouth) that necessitate immediate return. Medications administered to the patient during their visit and any new prescriptions provided to the patient are listed below.  Medications given during this visit Medications  colchicine tablet 1.2 mg (1.2 mg Oral Given 12/01/17 0832)  oxyCODONE (Oxy IR/ROXICODONE) immediate release tablet 5 mg (5 mg Oral Given 12/01/17 0832)  predniSONE (DELTASONE) tablet 60 mg (60 mg Oral Given 12/01/17 8850)      The patient appears reasonably screen and/or stabilized for discharge and I doubt any other medical condition or other [facility] requiring further screening, evaluation, or treatment in the ED at this time prior to discharge.    Final Clinical Impressions(s) / ED Diagnoses   Final diagnoses:  Acute gout due to renal impairment involving left ankle    ED Discharge Orders         Ordered    predniSONE (DELTASONE) 20 MG tablet     12/01/17 0826    colchicine 0.6 MG tablet     12/01/17 0826    oxyCODONE (ROXICODONE) 5 MG immediate release tablet  Every 4 hours PRN     12/01/17 [place], Harper, DO 12/01/17 [phone]

## 2017-12-01 NOTE — ED Triage Notes (Signed)
Gout in bilateral feet x 2 days. Hx of same.

## 2017-12-01 NOTE — Discharge Instructions (Signed)
Follow  up with your PCP, return for fever.

## 2017-12-05 DIAGNOSIS — E1122 Type 2 diabetes mellitus with diabetic chronic kidney disease: Secondary | ICD-10-CM | POA: Diagnosis not present

## 2017-12-05 DIAGNOSIS — I509 Heart failure, unspecified: Secondary | ICD-10-CM | POA: Diagnosis not present

## 2017-12-05 DIAGNOSIS — I132 Hypertensive heart and chronic kidney disease with heart failure and with stage 5 chronic kidney disease, or end stage renal disease: Secondary | ICD-10-CM | POA: Diagnosis not present

## 2017-12-05 DIAGNOSIS — N186 End stage renal disease: Secondary | ICD-10-CM | POA: Diagnosis not present

## 2017-12-05 DIAGNOSIS — Z7984 Long term (current) use of oral hypoglycemic drugs: Secondary | ICD-10-CM | POA: Diagnosis not present

## 2017-12-05 DIAGNOSIS — Z992 Dependence on renal dialysis: Secondary | ICD-10-CM | POA: Diagnosis not present

## 2017-12-30 ENCOUNTER — Emergency Department (HOSPITAL_BASED_OUTPATIENT_CLINIC_OR_DEPARTMENT_OTHER)
Admission: EM | Admit: 2017-12-30 | Discharge: 2017-12-30 | Disposition: A | Discharge status: Home / Self Care | Payer: Medicare HMO | Attending: Emergency Medicine | Admitting: Emergency Medicine

## 2017-12-30 ENCOUNTER — Encounter (HOSPITAL_BASED_OUTPATIENT_CLINIC_OR_DEPARTMENT_OTHER): Payer: Self-pay

## 2017-12-30 DIAGNOSIS — I5022 Chronic systolic (congestive) heart failure: Secondary | ICD-10-CM | POA: Diagnosis not present

## 2017-12-30 DIAGNOSIS — I2510 Atherosclerotic heart disease of native coronary artery without angina pectoris: Secondary | ICD-10-CM | POA: Diagnosis not present

## 2017-12-30 DIAGNOSIS — N183 Chronic kidney disease, stage 3 (moderate): Secondary | ICD-10-CM | POA: Diagnosis not present

## 2017-12-30 DIAGNOSIS — E119 Type 2 diabetes mellitus without complications: Secondary | ICD-10-CM | POA: Insufficient documentation

## 2017-12-30 DIAGNOSIS — M25572 Pain in left ankle and joints of left foot: Secondary | ICD-10-CM | POA: Diagnosis present

## 2017-12-30 DIAGNOSIS — I130 Hypertensive heart and chronic kidney disease with heart failure and stage 1 through stage 4 chronic kidney disease, or unspecified chronic kidney disease: Secondary | ICD-10-CM | POA: Diagnosis not present

## 2017-12-30 DIAGNOSIS — M10372 Gout due to renal impairment, left ankle and foot: Secondary | ICD-10-CM | POA: Insufficient documentation

## 2017-12-30 DIAGNOSIS — Z79899 Other long term (current) drug therapy: Secondary | ICD-10-CM | POA: Diagnosis not present

## 2017-12-30 MED ORDER — PREDNISONE 20 MG PO TABS: ORAL_TABLET | ORAL | 0 refills | Status: DC

## 2017-12-30 MED ORDER — HYDROCODONE-ACETAMINOPHEN 5-325 MG PO TABS: 1.0000 | ORAL_TABLET | ORAL | 0 refills | Status: DC | PRN

## 2017-12-30 MED ORDER — HYDROCODONE-ACETAMINOPHEN 5-325 MG PO TABS
1.0000 | ORAL_TABLET | Freq: Once | ORAL | Status: AC
  Administered 2017-12-30: 1 via ORAL
  Filled 2017-12-30: qty 1

## 2017-12-30 MED ORDER — PREDNISONE 50 MG PO TABS
60.0000 mg | ORAL_TABLET | Freq: Once | ORAL | Status: AC
  Administered 2017-12-30: 60 mg via ORAL
  Filled 2017-12-30: qty 1

## 2017-12-30 NOTE — ED Triage Notes (Signed)
Pt c/o lt foot pain, feels like gout, x2days

## 2017-12-30 NOTE — ED Provider Notes (Signed)
[facility] EMERGENCY DEPARTMENT Provider Note   CSN: [id number] Arrival date & time: 12/30/17  1062     History   Chief Complaint Chief Complaint  Patient presents with  . Foot Pain    HPI Joshua Fernandez is a 57 y.o. male.  HPI  57 year old male with a history of chronic kidney disease nearing dialysis as well as recurrent gout presents with a recurrent gout flare.  Started 2 days ago when he woke up.  It is in his left foot/ankle.  Has had gout there before and this feels very similar.  Very sensitive to the touch and the pain is currently severe.  He has not taken anything for the pain.  He has not noticed any fevers.  There is no weakness or numbness in the extremity.  No trauma.  Past Medical History:  Diagnosis Date  . AKI (acute kidney injury) ([place])   . Cardiomyopathy ([place])   . Cataract   . CHF (congestive heart failure) ([place])   . Coronary artery disease   . Diabetes mellitus without complication ([place])   . Gout   . Hyperlipemia   . Hyperparathyroidism ([place])   . Hypertension   . Open-angle glaucoma   . Pneumonia   . Renal disorder    chronic kidney disease stage 3  . Rheumatoid arthritis ([place])   . 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 Vitamin D deficiency     Patient Active Problem List   Diagnosis Date Noted  . Chronic systolic congestive heart failure ([place]) 04/26/2017  . Left knee pain 04/26/2017  . Cardiomyopathy ([place]) 02/04/2017  . Rheumatoid arthritis ([place]) 02/04/2017  . Diabetes mellitus without complication ([place]) 69/48/5462  . Coronary artery disease 02/04/2017  . Acute respiratory failure with hypoxia ([place]) 07/12/2016  . Hypokalemia 07/12/2016  . Hypomagnesemia 07/12/2016  . CKD (chronic kidney disease), stage III ([place]) 07/12/2016  . Sepsis ([place]) 07/10/2016  . Long term current use of anticoagulant therapy 09/08/2015  . Essential hypertension 06/27/2015  . Chronic gout of multiple sites 06/27/2015  .  Hyperlipemia 03/31/2015    Past Surgical History:  Procedure Laterality Date  . FRACTURE SURGERY          Home Medications    Prior to Admission medications   Medication Sig Start Date End Date Taking? Authorizing Provider  albuterol (PROVENTIL HFA;VENTOLIN HFA) 108 (90 Base) MCG/ACT inhaler Inhale 2 puffs every 4 (four) hours into the lungs. 07/18/16   [provider]  amLODipine (NORVASC) 10 MG tablet Take 10 mg daily by mouth. 11/01/16   [provider]  atorvastatin (LIPITOR) 40 MG tablet Take 40 mg by mouth every morning.     [provider]  azithromycin (ZITHROMAX) 250 MG tablet Take 1 tablet (250 mg total) daily by mouth. 02/04/17   Patrecia Pour, MD  budesonide-formoterol (SYMBICORT) 160-4.5 MCG/ACT inhaler Inhale 2 puffs 2 (two) times daily into the lungs. 07/18/16   [provider]  carvedilol (COREG) 25 MG tablet Take 25 mg by mouth 2 (two) times daily with a meal.    [provider]  cholecalciferol (VITAMIN D) 400 units TABS tablet Take 800 Units every morning by mouth.     [provider]  colchicine 0.6 MG tablet Take one tablet an hour after getting your first dose here 12/01/17   Deno Etienne, DO  digoxin (LANOXIN) 0.25 MG tablet Take 0.5 tablets (0.125 mg total) every morning by mouth. 02/04/17   Bonner Puna,  Meredith Leeds, MD  famotidine (PEPCID) 10 MG tablet Take 10 mg by mouth every morning.    [provider]  glipiZIDE (GLUCOTROL XL) 10 MG 24 hr tablet Take 10 mg by mouth daily with breakfast.    [provider]  guaiFENesin (MUCINEX) 600 MG 12 hr tablet Take 600 mg by mouth every morning.    [provider]  HYDROcodone-acetaminophen (NORCO) 5-325 MG tablet Take 1-2 tablets by mouth every 4 (four) hours as needed for severe pain. 12/30/17   Sherwood Gambler, MD  latanoprost (XALATAN) 0.005 % ophthalmic solution Place 1 drop into both eyes at bedtime as needed (irritation).     [provider]    lisinopril (PRINIVIL,ZESTRIL) 40 MG tablet Take 40 mg daily by mouth. 12/20/16   [provider]  oxyCODONE (ROXICODONE) 5 MG immediate release tablet Take 1 tablet (5 mg total) by mouth every 4 (four) hours as needed for severe pain. 12/01/17   Deno Etienne, DO  potassium chloride SA (K-DUR,KLOR-CON) 20 MEQ tablet Take 20 mEq by mouth every morning.     [provider]  predniSONE (DELTASONE) 20 MG tablet 2 tabs po daily x 4 days 12/31/17   Sherwood Gambler, MD    Family History No family history on file.  Social History Social History   Tobacco Use  . Smoking status: Never Smoker  . Smokeless tobacco: Never Used  Substance Use Topics  . Alcohol use: No  . Drug use: No     Allergies   Patient has no known allergies.   Review of Systems Review of Systems  Constitutional: Negative for fever.  Musculoskeletal: Positive for arthralgias and joint swelling.  Skin: Negative for color change.  Neurological: Negative for weakness and numbness.     Physical Exam Updated Vital Signs BP (!) 147/64 (BP Location: Right Arm)   Pulse 74   Temp 98.7 F (37.1 C) (Oral)   Resp 16   SpO2 96%   Physical Exam  Constitutional: He appears well-developed and well-nourished.  HENT:  Head: Normocephalic and atraumatic.  Eyes: Right eye exhibits no discharge. Left eye exhibits no discharge.  Cardiovascular: Normal rate and regular rhythm.  Pulses:      Dorsalis pedis pulses are 2+ on the left side.  Pulmonary/Chest: Effort normal.  Musculoskeletal:       Left ankle: He exhibits decreased range of motion and swelling. Tenderness.       Left foot: There is tenderness (proximal) and swelling.  Normal strength/sensation in left foot Swelling to ankle diffusely without skin changes Very tender to minimal palpation  Neurological: He is alert.  Skin: Skin is warm and dry. He is not diaphoretic. No erythema.  Psychiatric: His mood appears not anxious.  Nursing note and vitals  reviewed.    ED Treatments / Results  Labs (all labs ordered are listed, but only abnormal results are displayed) Labs Reviewed - No data to display  EKG None  Radiology No results found.  Procedures Procedures (including critical care time)  Medications Ordered in ED Medications  HYDROcodone-acetaminophen (NORCO/VICODIN) 5-325 MG per tablet 1 tablet (has no administration in time range)  predniSONE (DELTASONE) tablet 60 mg (has no administration in time range)     Initial Impression / Assessment and Plan / ED Course  I have reviewed the triage vital signs and the nursing notes.  Pertinent labs & imaging results that were available during my care of the patient were reviewed by me and considered in my medical  decision making (see chart for details).     This appears to be a recurrent gout flare for the patient.  My suspicion is low for septic joint given no fever/systemic symptoms, overlying erythema, and his long-standing history of gout.  Given his kidney disease, NSAIDs are not a good choice for him and thus I will use a steroid burst as well as Norco for pain.  Otherwise, follow-up with PCP.  Return precautions.  Final Clinical Impressions(s) / ED Diagnoses   Final diagnoses:  Acute gout due to renal impairment involving left ankle    ED Discharge Orders         Ordered    predniSONE (DELTASONE) 20 MG tablet     12/30/17 0910    HYDROcodone-acetaminophen (NORCO) 5-325 MG tablet  Every 4 hours PRN     12/30/17 0910           Sherwood Gambler, MD 12/30/17 [id number]

## 2017-12-30 NOTE — Discharge Instructions (Addendum)
If you develop fever, redness to the skin, heat to the skin, worsening pain, or any other new/concerning symptoms and return to the ER for evaluation.  You were given your first dose of steroids today, start the prednisone prescription tomorrow.

## 2018-01-06 DIAGNOSIS — I770 Arteriovenous fistula, acquired: Secondary | ICD-10-CM | POA: Diagnosis not present

## 2018-01-06 DIAGNOSIS — E1122 Type 2 diabetes mellitus with diabetic chronic kidney disease: Secondary | ICD-10-CM | POA: Diagnosis not present

## 2018-01-06 DIAGNOSIS — E876 Hypokalemia: Secondary | ICD-10-CM | POA: Diagnosis not present

## 2018-01-06 DIAGNOSIS — N2581 Secondary hyperparathyroidism of renal origin: Secondary | ICD-10-CM | POA: Diagnosis not present

## 2018-01-06 DIAGNOSIS — D631 Anemia in chronic kidney disease: Secondary | ICD-10-CM | POA: Diagnosis not present

## 2018-01-06 DIAGNOSIS — N185 Chronic kidney disease, stage 5: Secondary | ICD-10-CM | POA: Diagnosis not present

## 2018-01-06 DIAGNOSIS — I120 Hypertensive chronic kidney disease with stage 5 chronic kidney disease or end stage renal disease: Secondary | ICD-10-CM | POA: Diagnosis not present

## 2018-01-09 DIAGNOSIS — N185 Chronic kidney disease, stage 5: Secondary | ICD-10-CM | POA: Diagnosis not present

## 2018-01-09 DIAGNOSIS — D631 Anemia in chronic kidney disease: Secondary | ICD-10-CM | POA: Diagnosis not present

## 2018-01-14 DIAGNOSIS — H401132 Primary open-angle glaucoma, bilateral, moderate stage: Secondary | ICD-10-CM | POA: Diagnosis not present

## 2018-01-15 DIAGNOSIS — N186 End stage renal disease: Secondary | ICD-10-CM | POA: Diagnosis not present

## 2018-01-23 DIAGNOSIS — N185 Chronic kidney disease, stage 5: Secondary | ICD-10-CM | POA: Diagnosis not present

## 2018-01-23 DIAGNOSIS — D631 Anemia in chronic kidney disease: Secondary | ICD-10-CM | POA: Diagnosis not present

## 2018-02-09 DIAGNOSIS — N2581 Secondary hyperparathyroidism of renal origin: Secondary | ICD-10-CM | POA: Diagnosis not present

## 2018-02-09 DIAGNOSIS — D631 Anemia in chronic kidney disease: Secondary | ICD-10-CM | POA: Diagnosis not present

## 2018-02-09 DIAGNOSIS — N185 Chronic kidney disease, stage 5: Secondary | ICD-10-CM | POA: Diagnosis not present

## 2018-02-10 DIAGNOSIS — N2581 Secondary hyperparathyroidism of renal origin: Secondary | ICD-10-CM | POA: Diagnosis not present

## 2018-02-10 DIAGNOSIS — E1122 Type 2 diabetes mellitus with diabetic chronic kidney disease: Secondary | ICD-10-CM | POA: Diagnosis not present

## 2018-02-10 DIAGNOSIS — N185 Chronic kidney disease, stage 5: Secondary | ICD-10-CM | POA: Diagnosis not present

## 2018-02-10 DIAGNOSIS — D631 Anemia in chronic kidney disease: Secondary | ICD-10-CM | POA: Diagnosis not present

## 2018-02-10 DIAGNOSIS — E876 Hypokalemia: Secondary | ICD-10-CM | POA: Diagnosis not present

## 2018-02-10 DIAGNOSIS — I120 Hypertensive chronic kidney disease with stage 5 chronic kidney disease or end stage renal disease: Secondary | ICD-10-CM | POA: Diagnosis not present

## 2018-02-10 DIAGNOSIS — I770 Arteriovenous fistula, acquired: Secondary | ICD-10-CM | POA: Diagnosis not present

## 2018-02-17 ENCOUNTER — Encounter (HOSPITAL_BASED_OUTPATIENT_CLINIC_OR_DEPARTMENT_OTHER): Payer: Self-pay | Admitting: *Deleted

## 2018-02-17 ENCOUNTER — Emergency Department (HOSPITAL_BASED_OUTPATIENT_CLINIC_OR_DEPARTMENT_OTHER)
Admission: EM | Admit: 2018-02-17 | Discharge: 2018-02-17 | Disposition: A | Discharge status: Home / Self Care | Payer: Medicare HMO | Attending: Emergency Medicine | Admitting: Emergency Medicine

## 2018-02-17 ENCOUNTER — Other Ambulatory Visit: Payer: Self-pay

## 2018-02-17 DIAGNOSIS — I130 Hypertensive heart and chronic kidney disease with heart failure and stage 1 through stage 4 chronic kidney disease, or unspecified chronic kidney disease: Secondary | ICD-10-CM | POA: Insufficient documentation

## 2018-02-17 DIAGNOSIS — I5022 Chronic systolic (congestive) heart failure: Secondary | ICD-10-CM | POA: Insufficient documentation

## 2018-02-17 DIAGNOSIS — E1122 Type 2 diabetes mellitus with diabetic chronic kidney disease: Secondary | ICD-10-CM | POA: Diagnosis not present

## 2018-02-17 DIAGNOSIS — Z79899 Other long term (current) drug therapy: Secondary | ICD-10-CM | POA: Insufficient documentation

## 2018-02-17 DIAGNOSIS — N183 Chronic kidney disease, stage 3 (moderate): Secondary | ICD-10-CM | POA: Insufficient documentation

## 2018-02-17 DIAGNOSIS — M25572 Pain in left ankle and joints of left foot: Secondary | ICD-10-CM | POA: Diagnosis present

## 2018-02-17 DIAGNOSIS — Z7984 Long term (current) use of oral hypoglycemic drugs: Secondary | ICD-10-CM | POA: Diagnosis not present

## 2018-02-17 DIAGNOSIS — M10372 Gout due to renal impairment, left ankle and foot: Secondary | ICD-10-CM

## 2018-02-17 DIAGNOSIS — I2510 Atherosclerotic heart disease of native coronary artery without angina pectoris: Secondary | ICD-10-CM | POA: Diagnosis not present

## 2018-02-17 MED ORDER — HYDROCODONE-ACETAMINOPHEN 5-325 MG PO TABS: 1.0000 | ORAL_TABLET | ORAL | 0 refills | Status: DC | PRN

## 2018-02-17 MED ORDER — HYDROCODONE-ACETAMINOPHEN 5-325 MG PO TABS
2.0000 | ORAL_TABLET | Freq: Once | ORAL | Status: AC
  Administered 2018-02-17: 2 via ORAL
  Filled 2018-02-17: qty 2

## 2018-02-17 MED ORDER — PREDNISONE 20 MG PO TABS: ORAL_TABLET | ORAL | 0 refills | Status: DC

## 2018-02-17 NOTE — ED Provider Notes (Signed)
[facility] EMERGENCY DEPARTMENT Provider Note   CSN: [id number] Arrival date & time: 02/17/18  3154     History   Chief Complaint Chief Complaint  Patient presents with  . Ankle Pain    HPI Joshua Fernandez is a 57 y.o. male with history of CAD, CHF, diabetes, CKD, gout who presents with left ankle pain that began yesterday.  He reports it feels like his normal gout flareups.  He usually gets in his left ankle.  He reports yesterday having some pain in his left knee that was similar, however it moved to his ankle.  He has no pain in his knee now.  He denies any fevers.  He did not take any medication prior to arrival.  He reports he was given prednisone and Norco at his last visit which prompted his flare.  He is not currently on any preventative medication for his gout.  He denies any chest pain, shortness of breath, abdominal pain, nausea, vomiting, or any other symptoms.  He denies injury.  HPI  Past Medical History:  Diagnosis Date  . AKI (acute kidney injury) ([place])   . Cardiomyopathy ([place])   . Cataract   . CHF (congestive heart failure) ([place])   . Coronary artery disease   . Diabetes mellitus without complication ([place])   . Gout   . Hyperlipemia   . Hyperparathyroidism ([place])   . Hypertension   . Open-angle glaucoma   . Pneumonia   . Renal disorder    chronic kidney disease stage 3  . Rheumatoid arthritis ([place])   . 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 Vitamin D deficiency     Patient Active Problem List   Diagnosis Date Noted  . Chronic systolic congestive heart failure ([place]) 04/26/2017  . Left knee pain 04/26/2017  . Cardiomyopathy ([place]) 02/04/2017  . Rheumatoid arthritis ([place]) 02/04/2017  . Diabetes mellitus without complication ([place]) 00/86/7619  . Coronary artery disease 02/04/2017  . Acute respiratory failure with hypoxia ([place]) 07/12/2016  . Hypokalemia 07/12/2016  . Hypomagnesemia 07/12/2016  . CKD (chronic kidney  disease), stage III ([place]) 07/12/2016  . Sepsis ([place]) 07/10/2016  . Long term current use of anticoagulant therapy 09/08/2015  . Essential hypertension 06/27/2015  . Chronic gout of multiple sites 06/27/2015  . Hyperlipemia 03/31/2015    Past Surgical History:  Procedure Laterality Date  . FRACTURE SURGERY          Home Medications    Prior to Admission medications   Medication Sig Start Date End Date Taking? Authorizing Provider  albuterol (PROVENTIL HFA;VENTOLIN HFA) 108 (90 Base) MCG/ACT inhaler Inhale 2 puffs every 4 (four) hours into the lungs. 07/18/16   [provider]  amLODipine (NORVASC) 10 MG tablet Take 10 mg daily by mouth. 11/01/16   [provider]  atorvastatin (LIPITOR) 40 MG tablet Take 40 mg by mouth every morning.     [provider]  azithromycin (ZITHROMAX) 250 MG tablet Take 1 tablet (250 mg total) daily by mouth. 02/04/17   Patrecia Pour, MD  budesonide-formoterol (SYMBICORT) 160-4.5 MCG/ACT inhaler Inhale 2 puffs 2 (two) times daily into the lungs. 07/18/16   [provider]  carvedilol (COREG) 25 MG tablet Take 25 mg by mouth 2 (two) times daily with a meal.    [provider]  cholecalciferol (VITAMIN D) 400 units TABS tablet Take 800 Units every morning by mouth.     [provider]  colchicine 0.6 MG  tablet Take one tablet an hour after getting your first dose here 12/01/17   Deno Etienne, DO  digoxin (LANOXIN) 0.25 MG tablet Take 0.5 tablets (0.125 mg total) every morning by mouth. 02/04/17   Patrecia Pour, MD  famotidine (PEPCID) 10 MG tablet Take 10 mg by mouth every morning.    [provider]  glipiZIDE (GLUCOTROL XL) 10 MG 24 hr tablet Take 10 mg by mouth daily with breakfast.    [provider]  guaiFENesin (MUCINEX) 600 MG 12 hr tablet Take 600 mg by mouth every morning.    [provider]  HYDROcodone-acetaminophen (NORCO) 5-325 MG tablet Take 1-2 tablets by mouth every 4 (four)  hours as needed for severe pain. 02/17/18   Mckensey Berghuis, Bea Graff, PA-C  latanoprost (XALATAN) 0.005 % ophthalmic solution Place 1 drop into both eyes at bedtime as needed (irritation).     [provider]  lisinopril (PRINIVIL,ZESTRIL) 40 MG tablet Take 40 mg daily by mouth. 12/20/16   [provider]  oxyCODONE (ROXICODONE) 5 MG immediate release tablet Take 1 tablet (5 mg total) by mouth every 4 (four) hours as needed for severe pain. 12/01/17   Deno Etienne, DO  potassium chloride SA (K-DUR,KLOR-CON) 20 MEQ tablet Take 20 mEq by mouth every morning.     [provider]  predniSONE (DELTASONE) 20 MG tablet 2 tabs po daily x 4 days 02/17/18   Frederica Kuster, PA-C    Family History History reviewed. No pertinent family history.  Social History Social History   Tobacco Use  . Smoking status: Never Smoker  . Smokeless tobacco: Never Used  Substance Use Topics  . Alcohol use: No  . Drug use: No     Allergies   Patient has no known allergies.   Review of Systems Review of Systems  Constitutional: Negative for chills and fever.  HENT: Negative for facial swelling and sore throat.   Respiratory: Negative for shortness of breath.   Cardiovascular: Negative for chest pain.  Gastrointestinal: Negative for abdominal pain, nausea and vomiting.  Genitourinary: Negative for dysuria.  Musculoskeletal: Positive for arthralgias and joint swelling. Negative for back pain.  Skin: Negative for rash and wound.  Neurological: Negative for headaches.  Psychiatric/Behavioral: The patient is not nervous/anxious.      Physical Exam Updated Vital Signs BP (!) 151/71 (BP Location: Right Arm)   Pulse 64   Temp 98.5 F (36.9 C) (Oral)   Resp 20   Ht 5\' 11"  (1.803 m)   Wt 122.5 kg   SpO2 95%   BMI 37.66 kg/m   Physical Exam  Constitutional: He appears well-developed and well-nourished. No distress.  HENT:  Head: Normocephalic and atraumatic.  Mouth/Throat: Oropharynx  is clear and moist. No oropharyngeal exudate.  Eyes: Pupils are equal, round, and reactive to light. Conjunctivae are normal. Right eye exhibits no discharge. Left eye exhibits no discharge. No scleral icterus.  Neck: Normal range of motion. Neck supple. No thyromegaly present.  Cardiovascular: Normal rate, regular rhythm, normal heart sounds and intact distal pulses. Exam reveals no gallop and no friction rub.  No murmur heard. Pulmonary/Chest: Effort normal and breath sounds normal. No stridor. No respiratory distress. He has no wheezes. He has no rales.  Abdominal: Soft. Bowel sounds are normal. He exhibits no distension. There is no tenderness. There is no rebound and no guarding.  Musculoskeletal: He exhibits no edema.  L ankle: Tenderness with even very light touch to the joint, no warmth or  erythema, mild edema, minimal range of motion secondary to pain, sensation intact to the foot, DP pulse intact  Lymphadenopathy:    He has no cervical adenopathy.  Neurological: He is alert. Coordination normal.  Skin: Skin is warm and dry. No rash noted. He is not diaphoretic. No pallor.  Psychiatric: He has a normal mood and affect.  Nursing note and vitals reviewed.    ED Treatments / Results  Labs (all labs ordered are listed, but only abnormal results are displayed) Labs Reviewed - No data to display  EKG None  Radiology No results found.  Procedures Procedures (including critical care time)  Medications Ordered in ED Medications  HYDROcodone-acetaminophen (NORCO/VICODIN) 5-325 MG per tablet 2 tablet (has no administration in time range)     Initial Impression / Assessment and Plan / ED Course  I have reviewed the triage vital signs and the nursing notes.  Pertinent labs & imaging results that were available during my care of the patient were reviewed by me and considered in my medical decision making (see chart for details).     Patient with suspected gout flare to left  ankle.  He states it feels typical of his normal gout flares.  Low suspicion for septic joint.  Patient is afebrile.  Will treat with short course of prednisone and Norco.  I reviewed the [place] narcotic database and found no discrepancies.  Patient has an appointment with his doctor in 3 days.  Patient advised to discuss this with them at the appointment.  Return precautions discussed.  Patient understands and agrees with plan.  Patient vitals stable throughout ED course and discharged in satisfactory condition.  Final Clinical Impressions(s) / ED Diagnoses   Final diagnoses:  Acute gout due to renal impairment involving left ankle    ED Discharge Orders         Ordered    HYDROcodone-acetaminophen (NORCO) 5-325 MG tablet  Every 4 hours PRN     02/17/18 1022    predniSONE (DELTASONE) 20 MG tablet     02/17/18 [address], [place] 02/17/18 1028    Tegeler, Gwenyth Allegra, MD 02/17/18 1109

## 2018-02-17 NOTE — ED Triage Notes (Signed)
Pt reports gout flare onset yesterday to left knee, knee is now fine, pain and swelling is now located to left ankle, site of his usual gout flares.

## 2018-02-17 NOTE — Discharge Instructions (Addendum)
Take prednisone as prescribed until completed. Take 1-2 Norco every 4-6 hours as needed for severe pain. Please follow up with your doctor as scheduled this week for recheck.  Please return the emergency department if you develop any new or worsening symptoms including fever or any other different symptoms than you are used to with your gout.  Do not drink alcohol, drive, operate machinery or participate in any other potentially dangerous activities while taking opiate pain medication as it may make you sleepy. Do not take this medication with any other sedating medications, either prescription or over-the-counter. If you were prescribed Percocet or Vicodin, do not take these with acetaminophen (Tylenol) as it is already contained within these medications and overdose of Tylenol is dangerous.   This medication is an opiate (or narcotic) pain medication and can be habit forming.  Use it as little as possible to achieve adequate pain control.  Do not use or use it with extreme caution if you have a history of opiate abuse or dependence. This medication is intended for your use only - do not give any to anyone else and keep it in a secure place where nobody else, especially children, have access to it. It will also cause or worsen constipation, so you may want to consider taking an over-the-counter stool softener while you are taking this medication.

## 2018-02-20 DIAGNOSIS — N185 Chronic kidney disease, stage 5: Secondary | ICD-10-CM | POA: Diagnosis not present

## 2018-02-20 DIAGNOSIS — Z992 Dependence on renal dialysis: Secondary | ICD-10-CM | POA: Diagnosis not present

## 2018-02-20 DIAGNOSIS — I429 Cardiomyopathy, unspecified: Secondary | ICD-10-CM | POA: Diagnosis not present

## 2018-02-20 DIAGNOSIS — E1122 Type 2 diabetes mellitus with diabetic chronic kidney disease: Secondary | ICD-10-CM | POA: Diagnosis not present

## 2018-02-20 DIAGNOSIS — E0865 Diabetes mellitus due to underlying condition with hyperglycemia: Secondary | ICD-10-CM | POA: Diagnosis not present

## 2018-02-20 DIAGNOSIS — I120 Hypertensive chronic kidney disease with stage 5 chronic kidney disease or end stage renal disease: Secondary | ICD-10-CM | POA: Diagnosis not present

## 2018-02-20 DIAGNOSIS — E782 Mixed hyperlipidemia: Secondary | ICD-10-CM | POA: Diagnosis not present

## 2018-02-20 DIAGNOSIS — E0822 Diabetes mellitus due to underlying condition with diabetic chronic kidney disease: Secondary | ICD-10-CM | POA: Diagnosis not present

## 2018-02-20 DIAGNOSIS — I130 Hypertensive heart and chronic kidney disease with heart failure and stage 1 through stage 4 chronic kidney disease, or unspecified chronic kidney disease: Secondary | ICD-10-CM | POA: Diagnosis not present

## 2018-02-20 DIAGNOSIS — I509 Heart failure, unspecified: Secondary | ICD-10-CM | POA: Diagnosis not present

## 2018-02-20 DIAGNOSIS — M069 Rheumatoid arthritis, unspecified: Secondary | ICD-10-CM | POA: Diagnosis not present

## 2018-02-20 DIAGNOSIS — Z7689 Persons encountering health services in other specified circumstances: Secondary | ICD-10-CM | POA: Diagnosis not present

## 2018-02-27 DIAGNOSIS — N185 Chronic kidney disease, stage 5: Secondary | ICD-10-CM | POA: Diagnosis not present

## 2018-02-27 DIAGNOSIS — D631 Anemia in chronic kidney disease: Secondary | ICD-10-CM | POA: Diagnosis not present

## 2018-03-03 DIAGNOSIS — I5022 Chronic systolic (congestive) heart failure: Secondary | ICD-10-CM | POA: Diagnosis not present

## 2018-03-03 DIAGNOSIS — N185 Chronic kidney disease, stage 5: Secondary | ICD-10-CM | POA: Diagnosis not present

## 2018-03-03 DIAGNOSIS — E1122 Type 2 diabetes mellitus with diabetic chronic kidney disease: Secondary | ICD-10-CM | POA: Diagnosis not present

## 2018-03-03 DIAGNOSIS — I120 Hypertensive chronic kidney disease with stage 5 chronic kidney disease or end stage renal disease: Secondary | ICD-10-CM | POA: Diagnosis not present

## 2018-03-10 DIAGNOSIS — D631 Anemia in chronic kidney disease: Secondary | ICD-10-CM | POA: Diagnosis not present

## 2018-03-10 DIAGNOSIS — N185 Chronic kidney disease, stage 5: Secondary | ICD-10-CM | POA: Diagnosis not present

## 2018-03-13 DIAGNOSIS — N186 End stage renal disease: Secondary | ICD-10-CM | POA: Diagnosis not present

## 2018-03-13 DIAGNOSIS — I120 Hypertensive chronic kidney disease with stage 5 chronic kidney disease or end stage renal disease: Secondary | ICD-10-CM | POA: Diagnosis not present

## 2018-03-13 DIAGNOSIS — E119 Type 2 diabetes mellitus without complications: Secondary | ICD-10-CM | POA: Diagnosis not present

## 2018-03-13 DIAGNOSIS — R200 Anesthesia of skin: Secondary | ICD-10-CM | POA: Diagnosis not present

## 2018-03-13 DIAGNOSIS — Z79899 Other long term (current) drug therapy: Secondary | ICD-10-CM | POA: Diagnosis not present

## 2018-03-13 DIAGNOSIS — N184 Chronic kidney disease, stage 4 (severe): Secondary | ICD-10-CM | POA: Diagnosis not present

## 2018-03-13 DIAGNOSIS — Z992 Dependence on renal dialysis: Secondary | ICD-10-CM | POA: Diagnosis not present

## 2018-03-13 DIAGNOSIS — E1122 Type 2 diabetes mellitus with diabetic chronic kidney disease: Secondary | ICD-10-CM | POA: Diagnosis not present

## 2018-03-13 DIAGNOSIS — I132 Hypertensive heart and chronic kidney disease with heart failure and with stage 5 chronic kidney disease, or end stage renal disease: Secondary | ICD-10-CM | POA: Diagnosis not present

## 2018-03-13 DIAGNOSIS — T82898A Other specified complication of vascular prosthetic devices, implants and grafts, initial encounter: Secondary | ICD-10-CM | POA: Diagnosis not present

## 2018-03-30 DIAGNOSIS — I120 Hypertensive chronic kidney disease with stage 5 chronic kidney disease or end stage renal disease: Secondary | ICD-10-CM | POA: Diagnosis not present

## 2018-03-30 DIAGNOSIS — I2583 Coronary atherosclerosis due to lipid rich plaque: Secondary | ICD-10-CM | POA: Diagnosis not present

## 2018-03-30 DIAGNOSIS — N185 Chronic kidney disease, stage 5: Secondary | ICD-10-CM | POA: Diagnosis not present

## 2018-03-30 DIAGNOSIS — I770 Arteriovenous fistula, acquired: Secondary | ICD-10-CM | POA: Diagnosis not present

## 2018-03-30 DIAGNOSIS — E782 Mixed hyperlipidemia: Secondary | ICD-10-CM | POA: Diagnosis not present

## 2018-03-30 DIAGNOSIS — I2510 Atherosclerotic heart disease of native coronary artery without angina pectoris: Secondary | ICD-10-CM | POA: Diagnosis not present

## 2018-03-30 DIAGNOSIS — I429 Cardiomyopathy, unspecified: Secondary | ICD-10-CM | POA: Diagnosis not present

## 2018-03-30 DIAGNOSIS — Z09 Encounter for follow-up examination after completed treatment for conditions other than malignant neoplasm: Secondary | ICD-10-CM | POA: Diagnosis not present

## 2018-03-30 DIAGNOSIS — E1122 Type 2 diabetes mellitus with diabetic chronic kidney disease: Secondary | ICD-10-CM | POA: Diagnosis not present

## 2018-04-13 DIAGNOSIS — N185 Chronic kidney disease, stage 5: Secondary | ICD-10-CM | POA: Diagnosis not present

## 2018-04-13 DIAGNOSIS — D631 Anemia in chronic kidney disease: Secondary | ICD-10-CM | POA: Diagnosis not present

## 2018-04-16 DIAGNOSIS — Z95828 Presence of other vascular implants and grafts: Secondary | ICD-10-CM | POA: Diagnosis not present

## 2018-04-16 DIAGNOSIS — N185 Chronic kidney disease, stage 5: Secondary | ICD-10-CM | POA: Diagnosis not present

## 2018-04-17 DIAGNOSIS — E1122 Type 2 diabetes mellitus with diabetic chronic kidney disease: Secondary | ICD-10-CM | POA: Diagnosis not present

## 2018-04-17 DIAGNOSIS — N2581 Secondary hyperparathyroidism of renal origin: Secondary | ICD-10-CM | POA: Diagnosis not present

## 2018-04-17 DIAGNOSIS — I770 Arteriovenous fistula, acquired: Secondary | ICD-10-CM | POA: Diagnosis not present

## 2018-04-17 DIAGNOSIS — E876 Hypokalemia: Secondary | ICD-10-CM | POA: Diagnosis not present

## 2018-04-17 DIAGNOSIS — I120 Hypertensive chronic kidney disease with stage 5 chronic kidney disease or end stage renal disease: Secondary | ICD-10-CM | POA: Diagnosis not present

## 2018-04-17 DIAGNOSIS — D631 Anemia in chronic kidney disease: Secondary | ICD-10-CM | POA: Diagnosis not present

## 2018-04-17 DIAGNOSIS — N185 Chronic kidney disease, stage 5: Secondary | ICD-10-CM | POA: Diagnosis not present

## 2018-05-18 DIAGNOSIS — N185 Chronic kidney disease, stage 5: Secondary | ICD-10-CM | POA: Diagnosis not present

## 2018-05-18 DIAGNOSIS — N2581 Secondary hyperparathyroidism of renal origin: Secondary | ICD-10-CM | POA: Diagnosis not present

## 2018-05-18 DIAGNOSIS — D631 Anemia in chronic kidney disease: Secondary | ICD-10-CM | POA: Diagnosis not present

## 2018-05-19 DIAGNOSIS — D631 Anemia in chronic kidney disease: Secondary | ICD-10-CM | POA: Diagnosis not present

## 2018-05-19 DIAGNOSIS — E1122 Type 2 diabetes mellitus with diabetic chronic kidney disease: Secondary | ICD-10-CM | POA: Diagnosis not present

## 2018-05-19 DIAGNOSIS — M899 Disorder of bone, unspecified: Secondary | ICD-10-CM | POA: Diagnosis not present

## 2018-05-19 DIAGNOSIS — N189 Chronic kidney disease, unspecified: Secondary | ICD-10-CM | POA: Diagnosis not present

## 2018-05-19 DIAGNOSIS — N185 Chronic kidney disease, stage 5: Secondary | ICD-10-CM | POA: Diagnosis not present

## 2018-05-19 DIAGNOSIS — I120 Hypertensive chronic kidney disease with stage 5 chronic kidney disease or end stage renal disease: Secondary | ICD-10-CM | POA: Diagnosis not present

## 2018-05-19 DIAGNOSIS — I770 Arteriovenous fistula, acquired: Secondary | ICD-10-CM | POA: Diagnosis not present

## 2018-05-20 DIAGNOSIS — E0822 Diabetes mellitus due to underlying condition with diabetic chronic kidney disease: Secondary | ICD-10-CM | POA: Diagnosis not present

## 2018-05-20 DIAGNOSIS — E0865 Diabetes mellitus due to underlying condition with hyperglycemia: Secondary | ICD-10-CM | POA: Diagnosis not present

## 2018-05-20 DIAGNOSIS — Z992 Dependence on renal dialysis: Secondary | ICD-10-CM | POA: Diagnosis not present

## 2018-05-20 DIAGNOSIS — E1122 Type 2 diabetes mellitus with diabetic chronic kidney disease: Secondary | ICD-10-CM | POA: Diagnosis not present

## 2018-05-20 DIAGNOSIS — I120 Hypertensive chronic kidney disease with stage 5 chronic kidney disease or end stage renal disease: Secondary | ICD-10-CM | POA: Diagnosis not present

## 2018-05-20 DIAGNOSIS — N186 End stage renal disease: Secondary | ICD-10-CM | POA: Diagnosis not present

## 2018-05-20 DIAGNOSIS — N185 Chronic kidney disease, stage 5: Secondary | ICD-10-CM | POA: Diagnosis not present

## 2018-05-20 DIAGNOSIS — E11649 Type 2 diabetes mellitus with hypoglycemia without coma: Secondary | ICD-10-CM | POA: Diagnosis not present

## 2018-05-26 DIAGNOSIS — D509 Iron deficiency anemia, unspecified: Secondary | ICD-10-CM | POA: Diagnosis not present

## 2018-05-26 DIAGNOSIS — N185 Chronic kidney disease, stage 5: Secondary | ICD-10-CM | POA: Diagnosis not present

## 2018-05-26 DIAGNOSIS — D631 Anemia in chronic kidney disease: Secondary | ICD-10-CM | POA: Diagnosis not present

## 2018-05-26 DIAGNOSIS — E611 Iron deficiency: Secondary | ICD-10-CM | POA: Diagnosis not present

## 2018-05-28 DIAGNOSIS — E611 Iron deficiency: Secondary | ICD-10-CM | POA: Diagnosis not present

## 2018-05-28 DIAGNOSIS — D631 Anemia in chronic kidney disease: Secondary | ICD-10-CM | POA: Diagnosis not present

## 2018-05-28 DIAGNOSIS — D509 Iron deficiency anemia, unspecified: Secondary | ICD-10-CM | POA: Diagnosis not present

## 2018-05-28 DIAGNOSIS — N185 Chronic kidney disease, stage 5: Secondary | ICD-10-CM | POA: Diagnosis not present

## 2018-06-02 DIAGNOSIS — E611 Iron deficiency: Secondary | ICD-10-CM | POA: Diagnosis not present

## 2018-06-13 ENCOUNTER — Emergency Department (HOSPITAL_BASED_OUTPATIENT_CLINIC_OR_DEPARTMENT_OTHER)
Admission: EM | Admit: 2018-06-13 | Discharge: 2018-06-13 | Disposition: A | Discharge status: Home / Self Care | Payer: Medicare HMO | Attending: Emergency Medicine | Admitting: Emergency Medicine

## 2018-06-13 ENCOUNTER — Encounter (HOSPITAL_BASED_OUTPATIENT_CLINIC_OR_DEPARTMENT_OTHER): Payer: Self-pay | Admitting: *Deleted

## 2018-06-13 ENCOUNTER — Other Ambulatory Visit: Payer: Self-pay

## 2018-06-13 DIAGNOSIS — Z7901 Long term (current) use of anticoagulants: Secondary | ICD-10-CM | POA: Diagnosis not present

## 2018-06-13 DIAGNOSIS — I5022 Chronic systolic (congestive) heart failure: Secondary | ICD-10-CM | POA: Insufficient documentation

## 2018-06-13 DIAGNOSIS — N183 Chronic kidney disease, stage 3 (moderate): Secondary | ICD-10-CM | POA: Diagnosis not present

## 2018-06-13 DIAGNOSIS — M10371 Gout due to renal impairment, right ankle and foot: Secondary | ICD-10-CM | POA: Diagnosis not present

## 2018-06-13 DIAGNOSIS — M25571 Pain in right ankle and joints of right foot: Secondary | ICD-10-CM | POA: Diagnosis present

## 2018-06-13 DIAGNOSIS — E1122 Type 2 diabetes mellitus with diabetic chronic kidney disease: Secondary | ICD-10-CM | POA: Diagnosis not present

## 2018-06-13 DIAGNOSIS — I130 Hypertensive heart and chronic kidney disease with heart failure and stage 1 through stage 4 chronic kidney disease, or unspecified chronic kidney disease: Secondary | ICD-10-CM | POA: Insufficient documentation

## 2018-06-13 DIAGNOSIS — I2510 Atherosclerotic heart disease of native coronary artery without angina pectoris: Secondary | ICD-10-CM | POA: Insufficient documentation

## 2018-06-13 DIAGNOSIS — Z7984 Long term (current) use of oral hypoglycemic drugs: Secondary | ICD-10-CM | POA: Diagnosis not present

## 2018-06-13 DIAGNOSIS — Z79899 Other long term (current) drug therapy: Secondary | ICD-10-CM | POA: Diagnosis not present

## 2018-06-13 MED ORDER — HYDROCODONE-ACETAMINOPHEN 5-325 MG PO TABS
1.0000 | ORAL_TABLET | Freq: Four times a day (QID) | ORAL | 0 refills | Status: DC | PRN
Start: 2018-06-13 — End: 2018-09-27

## 2018-06-13 MED ORDER — PREDNISONE 20 MG PO TABS
40.0000 mg | ORAL_TABLET | Freq: Once | ORAL | Status: AC
  Administered 2018-06-13: 40 mg via ORAL
  Filled 2018-06-13: qty 2

## 2018-06-13 MED ORDER — HYDROCODONE-ACETAMINOPHEN 5-325 MG PO TABS
1.0000 | ORAL_TABLET | Freq: Once | ORAL | Status: AC
  Administered 2018-06-13: 1 via ORAL
  Filled 2018-06-13: qty 1

## 2018-06-13 NOTE — ED Notes (Signed)
Pt denies otc medications

## 2018-06-13 NOTE — Discharge Instructions (Signed)
Apply ice to your ankle for 20 minutes at a time to help with swelling. Elevate it as much as possible.

## 2018-06-13 NOTE — ED Notes (Signed)
ED Provider at bedside. 

## 2018-06-13 NOTE — ED Triage Notes (Signed)
Pt reports foot pain x 1 day. Believes it is a "gout flare-up"

## 2018-06-13 NOTE — ED Provider Notes (Signed)
[facility] EMERGENCY DEPARTMENT Provider Note   CSN: [id number] Arrival date & time: 06/13/18  1949    History   Chief Complaint Chief Complaint  Patient presents with  . Foot Pain    HPI Joshua Fernandez is a 58 y.o. male with past medical history of CKD, gout, diabetes, presenting to the emergency department with complaint of acute onset of ankle pain that began today.  Patient states pain is worse with movement and palpation.  Pain feels actually like his history of gout flares.  He has associated swelling.  Denies redness or fever. No injuries. States the medications he was prescribed from his last ED visit for gout provided him relief of symptoms.  He did not take any medications for pain prior to arrival.     The history is provided by the patient.    Past Medical History:  Diagnosis Date  . AKI (acute kidney injury) ([place])   . Cardiomyopathy ([place])   . Cataract   . CHF (congestive heart failure) ([place])   . Coronary artery disease   . Diabetes mellitus without complication ([place])   . Gout   . Hyperlipemia   . Hyperparathyroidism ([place])   . Hypertension   . Open-angle glaucoma   . Pneumonia   . Renal disorder    chronic kidney disease stage 3  . Rheumatoid arthritis ([place])   . 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 Vitamin D deficiency     Patient Active Problem List   Diagnosis Date Noted  . Chronic systolic congestive heart failure ([place]) 04/26/2017  . Left knee pain 04/26/2017  . Cardiomyopathy ([place]) 02/04/2017  . Rheumatoid arthritis ([place]) 02/04/2017  . Diabetes mellitus without complication ([place]) 39/76/7341  . Coronary artery disease 02/04/2017  . Acute respiratory failure with hypoxia ([place]) 07/12/2016  . Hypokalemia 07/12/2016  . Hypomagnesemia 07/12/2016  . CKD (chronic kidney disease), stage III ([place]) 07/12/2016  . Sepsis ([place]) 07/10/2016  . Long term current use of anticoagulant therapy 09/08/2015  . Essential  hypertension 06/27/2015  . Chronic gout of multiple sites 06/27/2015  . Hyperlipemia 03/31/2015    Past Surgical History:  Procedure Laterality Date  . FRACTURE SURGERY          Home Medications    Prior to Admission medications   Medication Sig Start Date End Date Taking? Authorizing Provider  albuterol (PROVENTIL HFA;VENTOLIN HFA) 108 (90 Base) MCG/ACT inhaler Inhale 2 puffs every 4 (four) hours into the lungs. 07/18/16   [provider]  amLODipine (NORVASC) 10 MG tablet Take 10 mg daily by mouth. 11/01/16   [provider]  atorvastatin (LIPITOR) 40 MG tablet Take 40 mg by mouth every morning.     [provider]  azithromycin (ZITHROMAX) 250 MG tablet Take 1 tablet (250 mg total) daily by mouth. 02/04/17   Patrecia Pour, MD  budesonide-formoterol (SYMBICORT) 160-4.5 MCG/ACT inhaler Inhale 2 puffs 2 (two) times daily into the lungs. 07/18/16   [provider]  carvedilol (COREG) 25 MG tablet Take 25 mg by mouth 2 (two) times daily with a meal.    [provider]  cholecalciferol (VITAMIN D) 400 units TABS tablet Take 800 Units every morning by mouth.     [provider]  colchicine 0.6 MG tablet Take one tablet an hour after getting your first dose here 12/01/17   Deno Etienne, DO  digoxin (LANOXIN) 0.25 MG tablet Take 0.5 tablets (0.125 mg total) every morning by  mouth. 02/04/17   Patrecia Pour, MD  famotidine (PEPCID) 10 MG tablet Take 10 mg by mouth every morning.    [provider]  glipiZIDE (GLUCOTROL XL) 10 MG 24 hr tablet Take 10 mg by mouth daily with breakfast.    [provider]  guaiFENesin (MUCINEX) 600 MG 12 hr tablet Take 600 mg by mouth every morning.    [provider]  HYDROcodone-acetaminophen (NORCO/VICODIN) 5-325 MG tablet Take 1-2 tablets by mouth every 6 (six) hours as needed. 06/13/18   Robinson, [place] N, PA-C  latanoprost (XALATAN) 0.005 % ophthalmic solution Place 1 drop into both eyes  at bedtime as needed (irritation).     [provider]  lisinopril (PRINIVIL,ZESTRIL) 40 MG tablet Take 40 mg daily by mouth. 12/20/16   [provider]  oxyCODONE (ROXICODONE) 5 MG immediate release tablet Take 1 tablet (5 mg total) by mouth every 4 (four) hours as needed for severe pain. 12/01/17   Deno Etienne, DO  potassium chloride SA (K-DUR,KLOR-CON) 20 MEQ tablet Take 20 mEq by mouth every morning.     [provider]  predniSONE (DELTASONE) 20 MG tablet 2 tabs po daily x 4 days 02/17/18   Frederica Kuster, PA-C    Family History No family history on file.  Social History Social History   Tobacco Use  . Smoking status: Never Smoker  . Smokeless tobacco: Never Used  Substance Use Topics  . Alcohol use: No  . Drug use: No     Allergies   Patient has no known allergies.   Review of Systems Review of Systems  Constitutional: Negative for chills and fever.  Musculoskeletal: Positive for arthralgias and joint swelling.  Skin: Negative for color change.     Physical Exam Updated Vital Signs BP (!) 160/73 (BP Location: Right Arm)   Pulse 69   Temp 97.8 F (36.6 C) (Oral)   Resp 18   Ht 5\' 11"  (1.803 m)   Wt 122.5 kg   SpO2 95%   BMI 37.66 kg/m   Physical Exam Vitals signs and nursing note reviewed.  Constitutional:      General: He is not in acute distress.    Appearance: He is well-developed. He is not ill-appearing.  HENT:     Head: Normocephalic and atraumatic.  Eyes:     Conjunctiva/sclera: Conjunctivae normal.  Cardiovascular:     Rate and Rhythm: Normal rate.  Pulmonary:     Effort: Pulmonary effort is normal.  Musculoskeletal:     Comments: Intact dorsalis pedis pulses bilaterally.  Right ankle with mild swelling and tenderness mostly to the medial aspect.  Patient is able to range ankle, however with limited range of motion secondary to pain.  There is no redness or warmth.  Normal Sensation.  No Deformities.  Neurological:      Mental Status: He is alert.  Psychiatric:        Mood and Affect: Mood normal.        Behavior: Behavior normal.      ED Treatments / Results  Labs (all labs ordered are listed, but only abnormal results are displayed) Labs Reviewed - No data to display  EKG None  Radiology No results found.  Procedures Procedures (including critical care time)  Medications Ordered in ED Medications  HYDROcodone-acetaminophen (NORCO/VICODIN) 5-325 MG per tablet 1 tablet (1 tablet Oral Given 06/13/18 2111)  predniSONE (DELTASONE) tablet 40 mg (40 mg Oral Given 06/13/18 2112)     Initial Impression /  Assessment and Plan / ED Course  I have reviewed the triage vital signs and the nursing notes.  Pertinent labs & imaging results that were available during my care of the patient were reviewed by me and considered in my medical decision making (see chart for details).        Pt presents with monoarticular pain and swelling.  Symptoms are typical for his history of gout flares.  Pt is afebrile and stable.  Doubt septic arthritis.  Patient has chronic kidney disease.  Will prescribe pain medication and short course of prednisone.  Instructed patient to monitor his blood glucose while taking this medication, and instructed to discontinue if hypoglycemia occurs.  Patient instructed to follow-up closely with PCP.  Strict return precautions.  Patient agreeable to plan and safe for discharge.  [place] Controlled Substance reporting System queried  Patient discussed with Dr. Tamera Punt who agrees with plan.  Discussed results, findings, treatment and follow up. Patient advised of return precautions. Patient verbalized understanding and agreed with plan.   Final Clinical Impressions(s) / ED Diagnoses   Final diagnoses:  Acute gout due to renal impairment involving right ankle    ED Discharge Orders         Ordered    HYDROcodone-acetaminophen (NORCO/VICODIN) 5-325 MG tablet  Every 6 hours  PRN     06/13/18 2112           Robinson, [place] N, PA-C 06/13/18 2150    Malvin Johns, MD 06/13/18 2320

## 2018-06-19 DIAGNOSIS — M899 Disorder of bone, unspecified: Secondary | ICD-10-CM | POA: Diagnosis not present

## 2018-06-19 DIAGNOSIS — E611 Iron deficiency: Secondary | ICD-10-CM | POA: Diagnosis not present

## 2018-06-19 DIAGNOSIS — D631 Anemia in chronic kidney disease: Secondary | ICD-10-CM | POA: Diagnosis not present

## 2018-06-19 DIAGNOSIS — I120 Hypertensive chronic kidney disease with stage 5 chronic kidney disease or end stage renal disease: Secondary | ICD-10-CM | POA: Diagnosis not present

## 2018-06-19 DIAGNOSIS — E1122 Type 2 diabetes mellitus with diabetic chronic kidney disease: Secondary | ICD-10-CM | POA: Diagnosis not present

## 2018-06-19 DIAGNOSIS — N189 Chronic kidney disease, unspecified: Secondary | ICD-10-CM | POA: Diagnosis not present

## 2018-06-19 DIAGNOSIS — N2581 Secondary hyperparathyroidism of renal origin: Secondary | ICD-10-CM | POA: Diagnosis not present

## 2018-06-19 DIAGNOSIS — N185 Chronic kidney disease, stage 5: Secondary | ICD-10-CM | POA: Diagnosis not present

## 2018-06-29 DIAGNOSIS — N185 Chronic kidney disease, stage 5: Secondary | ICD-10-CM | POA: Diagnosis not present

## 2018-06-29 DIAGNOSIS — D631 Anemia in chronic kidney disease: Secondary | ICD-10-CM | POA: Diagnosis not present

## 2018-07-27 DIAGNOSIS — N185 Chronic kidney disease, stage 5: Secondary | ICD-10-CM | POA: Diagnosis not present

## 2018-07-29 DIAGNOSIS — M899 Disorder of bone, unspecified: Secondary | ICD-10-CM | POA: Diagnosis not present

## 2018-07-29 DIAGNOSIS — N189 Chronic kidney disease, unspecified: Secondary | ICD-10-CM | POA: Diagnosis not present

## 2018-07-29 DIAGNOSIS — I120 Hypertensive chronic kidney disease with stage 5 chronic kidney disease or end stage renal disease: Secondary | ICD-10-CM | POA: Diagnosis not present

## 2018-07-29 DIAGNOSIS — E1122 Type 2 diabetes mellitus with diabetic chronic kidney disease: Secondary | ICD-10-CM | POA: Diagnosis not present

## 2018-07-29 DIAGNOSIS — N185 Chronic kidney disease, stage 5: Secondary | ICD-10-CM | POA: Diagnosis not present

## 2018-07-29 DIAGNOSIS — D631 Anemia in chronic kidney disease: Secondary | ICD-10-CM | POA: Diagnosis not present

## 2018-08-10 DIAGNOSIS — D631 Anemia in chronic kidney disease: Secondary | ICD-10-CM | POA: Diagnosis not present

## 2018-08-10 DIAGNOSIS — N189 Chronic kidney disease, unspecified: Secondary | ICD-10-CM | POA: Diagnosis not present

## 2018-08-10 DIAGNOSIS — N185 Chronic kidney disease, stage 5: Secondary | ICD-10-CM | POA: Diagnosis not present

## 2018-08-10 DIAGNOSIS — M899 Disorder of bone, unspecified: Secondary | ICD-10-CM | POA: Diagnosis not present

## 2018-08-12 DIAGNOSIS — N2581 Secondary hyperparathyroidism of renal origin: Secondary | ICD-10-CM | POA: Diagnosis not present

## 2018-08-12 DIAGNOSIS — N185 Chronic kidney disease, stage 5: Secondary | ICD-10-CM | POA: Diagnosis not present

## 2018-08-12 DIAGNOSIS — Z7984 Long term (current) use of oral hypoglycemic drugs: Secondary | ICD-10-CM | POA: Diagnosis not present

## 2018-08-12 DIAGNOSIS — E1122 Type 2 diabetes mellitus with diabetic chronic kidney disease: Secondary | ICD-10-CM | POA: Diagnosis not present

## 2018-08-12 DIAGNOSIS — M899 Disorder of bone, unspecified: Secondary | ICD-10-CM | POA: Diagnosis not present

## 2018-08-12 DIAGNOSIS — I120 Hypertensive chronic kidney disease with stage 5 chronic kidney disease or end stage renal disease: Secondary | ICD-10-CM | POA: Diagnosis not present

## 2018-08-12 DIAGNOSIS — D631 Anemia in chronic kidney disease: Secondary | ICD-10-CM | POA: Diagnosis not present

## 2018-08-26 DIAGNOSIS — N185 Chronic kidney disease, stage 5: Secondary | ICD-10-CM | POA: Diagnosis not present

## 2018-08-26 DIAGNOSIS — D631 Anemia in chronic kidney disease: Secondary | ICD-10-CM | POA: Diagnosis not present

## 2018-09-15 DIAGNOSIS — D631 Anemia in chronic kidney disease: Secondary | ICD-10-CM | POA: Diagnosis not present

## 2018-09-15 DIAGNOSIS — N185 Chronic kidney disease, stage 5: Secondary | ICD-10-CM | POA: Diagnosis not present

## 2018-09-23 DIAGNOSIS — N185 Chronic kidney disease, stage 5: Secondary | ICD-10-CM | POA: Diagnosis not present

## 2018-09-23 DIAGNOSIS — D631 Anemia in chronic kidney disease: Secondary | ICD-10-CM | POA: Diagnosis not present

## 2018-09-23 DIAGNOSIS — M899 Disorder of bone, unspecified: Secondary | ICD-10-CM | POA: Diagnosis not present

## 2018-09-23 DIAGNOSIS — E876 Hypokalemia: Secondary | ICD-10-CM | POA: Diagnosis not present

## 2018-09-23 DIAGNOSIS — I120 Hypertensive chronic kidney disease with stage 5 chronic kidney disease or end stage renal disease: Secondary | ICD-10-CM | POA: Diagnosis not present

## 2018-09-23 DIAGNOSIS — Z7984 Long term (current) use of oral hypoglycemic drugs: Secondary | ICD-10-CM | POA: Diagnosis not present

## 2018-09-23 DIAGNOSIS — E1122 Type 2 diabetes mellitus with diabetic chronic kidney disease: Secondary | ICD-10-CM | POA: Diagnosis not present

## 2018-09-27 ENCOUNTER — Other Ambulatory Visit: Payer: Self-pay

## 2018-09-27 ENCOUNTER — Emergency Department (HOSPITAL_BASED_OUTPATIENT_CLINIC_OR_DEPARTMENT_OTHER)
Admission: EM | Admit: 2018-09-27 | Discharge: 2018-09-27 | Disposition: A | Discharge status: Home / Self Care | Payer: Medicare HMO | Attending: Emergency Medicine | Admitting: Emergency Medicine

## 2018-09-27 ENCOUNTER — Encounter (HOSPITAL_BASED_OUTPATIENT_CLINIC_OR_DEPARTMENT_OTHER): Payer: Self-pay | Admitting: Emergency Medicine

## 2018-09-27 DIAGNOSIS — I130 Hypertensive heart and chronic kidney disease with heart failure and stage 1 through stage 4 chronic kidney disease, or unspecified chronic kidney disease: Secondary | ICD-10-CM | POA: Insufficient documentation

## 2018-09-27 DIAGNOSIS — N183 Chronic kidney disease, stage 3 (moderate): Secondary | ICD-10-CM | POA: Insufficient documentation

## 2018-09-27 DIAGNOSIS — I5022 Chronic systolic (congestive) heart failure: Secondary | ICD-10-CM | POA: Insufficient documentation

## 2018-09-27 DIAGNOSIS — M109 Gout, unspecified: Secondary | ICD-10-CM | POA: Diagnosis not present

## 2018-09-27 DIAGNOSIS — Z79899 Other long term (current) drug therapy: Secondary | ICD-10-CM | POA: Diagnosis not present

## 2018-09-27 DIAGNOSIS — M79671 Pain in right foot: Secondary | ICD-10-CM | POA: Diagnosis present

## 2018-09-27 DIAGNOSIS — I2510 Atherosclerotic heart disease of native coronary artery without angina pectoris: Secondary | ICD-10-CM | POA: Insufficient documentation

## 2018-09-27 DIAGNOSIS — E1122 Type 2 diabetes mellitus with diabetic chronic kidney disease: Secondary | ICD-10-CM | POA: Diagnosis not present

## 2018-09-27 MED ORDER — HYDROCODONE-ACETAMINOPHEN 5-325 MG PO TABS: 1.0000 | ORAL_TABLET | Freq: Four times a day (QID) | ORAL | 0 refills | Status: AC | PRN

## 2018-09-27 MED ORDER — PREDNISONE 10 MG PO TABS: 20.0000 mg | ORAL_TABLET | Freq: Two times a day (BID) | ORAL | 0 refills | Status: AC

## 2018-09-27 NOTE — ED Provider Notes (Signed)
[facility] EMERGENCY DEPARTMENT Provider Note   CSN: [id number] Arrival date & time: 09/27/18  6659     History   Chief Complaint Chief Complaint  Patient presents with  . Foot Pain    HPI Joshua Fernandez is a 58 y.o. male.     Patient is a 58 year old male with past medical history of diabetes, chronic renal insufficiency, CHF, and recurrent gout.  He presents today for evaluation of severe right foot pain.  This is worsened over the past 2 days.  He denies any specific injury or trauma.  He denies any fevers or chills.  This feels identical to prior flareups of gout.  The history is provided by the patient.  Foot Pain This is a recurrent problem. The current episode started yesterday. The problem occurs constantly. The problem has been rapidly worsening. The symptoms are aggravated by walking. Nothing relieves the symptoms. He has tried nothing for the symptoms.    Past Medical History:  Diagnosis Date  . AKI (acute kidney injury) ([place])   . Cardiomyopathy ([place])   . Cataract   . CHF (congestive heart failure) ([place])   . Coronary artery disease   . Diabetes mellitus without complication ([place])   . Gout   . Hyperlipemia   . Hyperparathyroidism ([place])   . Hypertension   . Open-angle glaucoma   . Pneumonia   . Renal disorder    chronic kidney disease stage 3  . Rheumatoid arthritis ([place])   . 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 Vitamin D deficiency     Patient Active Problem List   Diagnosis Date Noted  . Chronic systolic congestive heart failure ([place]) 04/26/2017  . Left knee pain 04/26/2017  . Cardiomyopathy ([place]) 02/04/2017  . Rheumatoid arthritis ([place]) 02/04/2017  . Diabetes mellitus without complication ([place]) 93/57/0177  . Coronary artery disease 02/04/2017  . Acute respiratory failure with hypoxia ([place]) 07/12/2016  . Hypokalemia 07/12/2016  . Hypomagnesemia 07/12/2016  . CKD (chronic kidney disease), stage III ([place])  07/12/2016  . Sepsis ([place]) 07/10/2016  . Long term current use of anticoagulant therapy 09/08/2015  . Essential hypertension 06/27/2015  . Chronic gout of multiple sites 06/27/2015  . Hyperlipemia 03/31/2015    Past Surgical History:  Procedure Laterality Date  . FRACTURE SURGERY          Home Medications    Prior to Admission medications   Medication Sig Start Date End Date Taking? Authorizing Provider  albuterol (PROVENTIL HFA;VENTOLIN HFA) 108 (90 Base) MCG/ACT inhaler Inhale 2 puffs every 4 (four) hours into the lungs. 07/18/16   [provider]  amLODipine (NORVASC) 10 MG tablet Take 10 mg daily by mouth. 11/01/16   [provider]  atorvastatin (LIPITOR) 40 MG tablet Take 40 mg by mouth every morning.     [provider]  azithromycin (ZITHROMAX) 250 MG tablet Take 1 tablet (250 mg total) daily by mouth. 02/04/17   Patrecia Pour, MD  budesonide-formoterol (SYMBICORT) 160-4.5 MCG/ACT inhaler Inhale 2 puffs 2 (two) times daily into the lungs. 07/18/16   [provider]  carvedilol (COREG) 25 MG tablet Take 25 mg by mouth 2 (two) times daily with a meal.    [provider]  cholecalciferol (VITAMIN D) 400 units TABS tablet Take 800 Units every morning by mouth.     [provider]  colchicine 0.6 MG tablet Take one tablet an hour after getting your first dose here 12/01/17  Deno Etienne, DO  digoxin (LANOXIN) 0.25 MG tablet Take 0.5 tablets (0.125 mg total) every morning by mouth. 02/04/17   Patrecia Pour, MD  famotidine (PEPCID) 10 MG tablet Take 10 mg by mouth every morning.    [provider]  glipiZIDE (GLUCOTROL XL) 10 MG 24 hr tablet Take 10 mg by mouth daily with breakfast.    [provider]  guaiFENesin (MUCINEX) 600 MG 12 hr tablet Take 600 mg by mouth every morning.    [provider]  HYDROcodone-acetaminophen (NORCO/VICODIN) 5-325 MG tablet Take 1-2 tablets by mouth every 6 (six) hours as needed.  06/13/18   Robinson, [place] N, PA-C  latanoprost (XALATAN) 0.005 % ophthalmic solution Place 1 drop into both eyes at bedtime as needed (irritation).     [provider]  lisinopril (PRINIVIL,ZESTRIL) 40 MG tablet Take 40 mg daily by mouth. 12/20/16   [provider]  oxyCODONE (ROXICODONE) 5 MG immediate release tablet Take 1 tablet (5 mg total) by mouth every 4 (four) hours as needed for severe pain. 12/01/17   Deno Etienne, DO  potassium chloride SA (K-DUR,KLOR-CON) 20 MEQ tablet Take 20 mEq by mouth every morning.     [provider]  predniSONE (DELTASONE) 20 MG tablet 2 tabs po daily x 4 days 02/17/18   Frederica Kuster, PA-C    Family History No family history on file.  Social History Social History   Tobacco Use  . Smoking status: Never Smoker  . Smokeless tobacco: Never Used  Substance Use Topics  . Alcohol use: No  . Drug use: No     Allergies   Patient has no known allergies.   Review of Systems Review of Systems  All other systems reviewed and are negative.    Physical Exam Updated Vital Signs BP (!) 159/69 (BP Location: Right Arm)   Pulse 72   Temp 98.3 F (36.8 C) (Oral)   Resp 20   Ht 5\' 11"  (1.803 m)   Wt 120.2 kg   SpO2 97%   BMI 36.96 kg/m   Physical Exam Vitals signs and nursing note reviewed.  Constitutional:      General: He is not in acute distress.    Appearance: Normal appearance. He is not ill-appearing.  HENT:     Head: Normocephalic and atraumatic.  Pulmonary:     Effort: Pulmonary effort is normal.  Musculoskeletal:     Comments: The right foot appears grossly normal.  There is significant discomfort over the dorsum of the first and second metatarsal and pain with range of motion.  Skin:    General: Skin is warm and dry.  Neurological:     Mental Status: He is alert.      ED Treatments / Results  Labs (all labs ordered are listed, but only abnormal results are displayed) Labs Reviewed - No data to  display  EKG    Radiology No results found.  Procedures Procedures (including critical care time)  Medications Ordered in ED Medications - No data to display   Initial Impression / Assessment and Plan / ED Course  I have reviewed the triage vital signs and the nursing notes.  Pertinent labs & imaging results that were available during my care of the patient were reviewed by me and considered in my medical decision making (see chart for details).  This appears to be gout.  Patient will be treated with prednisone, pain medication, and follow-up as needed.  [employer] narcotic database reviewed  prior to prescriptions being written.  Final Clinical Impressions(s) / ED Diagnoses   Final diagnoses:  None    ED Discharge Orders    None       Veryl Speak, MD 09/27/18 [phone]

## 2018-09-27 NOTE — Discharge Instructions (Signed)
Prednisone as prescribed.  Hydrocodone as prescribed as needed for pain.  Follow-up with primary doctor if symptoms or not improving in the next few days.

## 2018-09-27 NOTE — ED Triage Notes (Signed)
R foot pain since yesterday. Hx of gout.

## 2018-10-09 DIAGNOSIS — D631 Anemia in chronic kidney disease: Secondary | ICD-10-CM | POA: Diagnosis not present

## 2018-10-09 DIAGNOSIS — N185 Chronic kidney disease, stage 5: Secondary | ICD-10-CM | POA: Diagnosis not present

## 2018-10-27 DIAGNOSIS — D631 Anemia in chronic kidney disease: Secondary | ICD-10-CM | POA: Diagnosis not present

## 2018-10-27 DIAGNOSIS — N185 Chronic kidney disease, stage 5: Secondary | ICD-10-CM | POA: Diagnosis not present

## 2018-10-27 DIAGNOSIS — M899 Disorder of bone, unspecified: Secondary | ICD-10-CM | POA: Diagnosis not present

## 2018-10-27 DIAGNOSIS — N189 Chronic kidney disease, unspecified: Secondary | ICD-10-CM | POA: Diagnosis not present

## 2018-10-28 DIAGNOSIS — E1122 Type 2 diabetes mellitus with diabetic chronic kidney disease: Secondary | ICD-10-CM | POA: Diagnosis not present

## 2018-10-28 DIAGNOSIS — N2581 Secondary hyperparathyroidism of renal origin: Secondary | ICD-10-CM | POA: Diagnosis not present

## 2018-10-28 DIAGNOSIS — I120 Hypertensive chronic kidney disease with stage 5 chronic kidney disease or end stage renal disease: Secondary | ICD-10-CM | POA: Diagnosis not present

## 2018-10-28 DIAGNOSIS — D631 Anemia in chronic kidney disease: Secondary | ICD-10-CM | POA: Diagnosis not present

## 2018-10-28 DIAGNOSIS — N185 Chronic kidney disease, stage 5: Secondary | ICD-10-CM | POA: Diagnosis not present

## 2018-10-28 DIAGNOSIS — M899 Disorder of bone, unspecified: Secondary | ICD-10-CM | POA: Diagnosis not present

## 2018-11-12 DIAGNOSIS — D631 Anemia in chronic kidney disease: Secondary | ICD-10-CM | POA: Diagnosis not present

## 2018-11-12 DIAGNOSIS — N185 Chronic kidney disease, stage 5: Secondary | ICD-10-CM | POA: Diagnosis not present

## 2018-11-16 DIAGNOSIS — M109 Gout, unspecified: Secondary | ICD-10-CM | POA: Diagnosis not present

## 2018-11-19 DIAGNOSIS — N189 Chronic kidney disease, unspecified: Secondary | ICD-10-CM | POA: Diagnosis not present

## 2018-11-19 DIAGNOSIS — D631 Anemia in chronic kidney disease: Secondary | ICD-10-CM | POA: Diagnosis not present

## 2018-11-19 DIAGNOSIS — N185 Chronic kidney disease, stage 5: Secondary | ICD-10-CM | POA: Diagnosis not present

## 2018-11-19 DIAGNOSIS — M899 Disorder of bone, unspecified: Secondary | ICD-10-CM | POA: Diagnosis not present

## 2018-11-25 DIAGNOSIS — E782 Mixed hyperlipidemia: Secondary | ICD-10-CM | POA: Diagnosis not present

## 2018-11-25 DIAGNOSIS — I428 Other cardiomyopathies: Secondary | ICD-10-CM | POA: Diagnosis not present

## 2018-11-25 DIAGNOSIS — N186 End stage renal disease: Secondary | ICD-10-CM | POA: Diagnosis not present

## 2018-11-25 DIAGNOSIS — E0865 Diabetes mellitus due to underlying condition with hyperglycemia: Secondary | ICD-10-CM | POA: Diagnosis not present

## 2018-11-25 DIAGNOSIS — N185 Chronic kidney disease, stage 5: Secondary | ICD-10-CM | POA: Diagnosis not present

## 2018-11-25 DIAGNOSIS — I509 Heart failure, unspecified: Secondary | ICD-10-CM | POA: Diagnosis not present

## 2018-11-25 DIAGNOSIS — Z992 Dependence on renal dialysis: Secondary | ICD-10-CM | POA: Diagnosis not present

## 2018-11-25 DIAGNOSIS — E1122 Type 2 diabetes mellitus with diabetic chronic kidney disease: Secondary | ICD-10-CM | POA: Diagnosis not present

## 2018-11-25 DIAGNOSIS — I130 Hypertensive heart and chronic kidney disease with heart failure and stage 1 through stage 4 chronic kidney disease, or unspecified chronic kidney disease: Secondary | ICD-10-CM | POA: Diagnosis not present

## 2018-11-25 DIAGNOSIS — Z1159 Encounter for screening for other viral diseases: Secondary | ICD-10-CM | POA: Diagnosis not present

## 2018-11-25 DIAGNOSIS — E0822 Diabetes mellitus due to underlying condition with diabetic chronic kidney disease: Secondary | ICD-10-CM | POA: Diagnosis not present

## 2018-11-25 DIAGNOSIS — I770 Arteriovenous fistula, acquired: Secondary | ICD-10-CM | POA: Diagnosis not present

## 2018-12-08 DIAGNOSIS — D631 Anemia in chronic kidney disease: Secondary | ICD-10-CM | POA: Diagnosis not present

## 2018-12-08 DIAGNOSIS — M899 Disorder of bone, unspecified: Secondary | ICD-10-CM | POA: Diagnosis not present

## 2018-12-08 DIAGNOSIS — N189 Chronic kidney disease, unspecified: Secondary | ICD-10-CM | POA: Diagnosis not present

## 2018-12-08 DIAGNOSIS — N185 Chronic kidney disease, stage 5: Secondary | ICD-10-CM | POA: Diagnosis not present

## 2018-12-08 DIAGNOSIS — E876 Hypokalemia: Secondary | ICD-10-CM | POA: Diagnosis not present

## 2018-12-08 DIAGNOSIS — E1122 Type 2 diabetes mellitus with diabetic chronic kidney disease: Secondary | ICD-10-CM | POA: Diagnosis not present

## 2018-12-08 DIAGNOSIS — I120 Hypertensive chronic kidney disease with stage 5 chronic kidney disease or end stage renal disease: Secondary | ICD-10-CM | POA: Diagnosis not present

## 2018-12-26 DIAGNOSIS — N189 Chronic kidney disease, unspecified: Secondary | ICD-10-CM | POA: Diagnosis not present

## 2018-12-26 DIAGNOSIS — N185 Chronic kidney disease, stage 5: Secondary | ICD-10-CM | POA: Diagnosis not present

## 2018-12-26 DIAGNOSIS — M899 Disorder of bone, unspecified: Secondary | ICD-10-CM | POA: Diagnosis not present

## 2019-01-13 DIAGNOSIS — M899 Disorder of bone, unspecified: Secondary | ICD-10-CM | POA: Diagnosis not present

## 2019-01-13 DIAGNOSIS — I770 Arteriovenous fistula, acquired: Secondary | ICD-10-CM | POA: Diagnosis not present

## 2019-01-13 DIAGNOSIS — I120 Hypertensive chronic kidney disease with stage 5 chronic kidney disease or end stage renal disease: Secondary | ICD-10-CM | POA: Diagnosis not present

## 2019-01-13 DIAGNOSIS — N189 Chronic kidney disease, unspecified: Secondary | ICD-10-CM | POA: Diagnosis not present

## 2019-01-13 DIAGNOSIS — N185 Chronic kidney disease, stage 5: Secondary | ICD-10-CM | POA: Diagnosis not present

## 2019-01-13 DIAGNOSIS — E1122 Type 2 diabetes mellitus with diabetic chronic kidney disease: Secondary | ICD-10-CM | POA: Diagnosis not present

## 2019-02-09 DIAGNOSIS — N185 Chronic kidney disease, stage 5: Secondary | ICD-10-CM | POA: Diagnosis not present

## 2019-02-11 DIAGNOSIS — E876 Hypokalemia: Secondary | ICD-10-CM | POA: Diagnosis not present

## 2019-02-11 DIAGNOSIS — N185 Chronic kidney disease, stage 5: Secondary | ICD-10-CM | POA: Diagnosis not present

## 2019-02-11 DIAGNOSIS — E1122 Type 2 diabetes mellitus with diabetic chronic kidney disease: Secondary | ICD-10-CM | POA: Diagnosis not present

## 2019-02-11 DIAGNOSIS — I120 Hypertensive chronic kidney disease with stage 5 chronic kidney disease or end stage renal disease: Secondary | ICD-10-CM | POA: Diagnosis not present

## 2019-02-11 DIAGNOSIS — D631 Anemia in chronic kidney disease: Secondary | ICD-10-CM | POA: Diagnosis not present

## 2019-02-11 DIAGNOSIS — M899 Disorder of bone, unspecified: Secondary | ICD-10-CM | POA: Diagnosis not present

## 2019-02-17 IMAGING — CR DG CHEST 2V
2 series · 2 of 2 positions shown · non-contrast
Comparison: 08/05/2016.

CLINICAL DATA: Cough, nasal congestion, and facial pain.

EXAM:
CHEST  2 VIEW

[w chest pa]
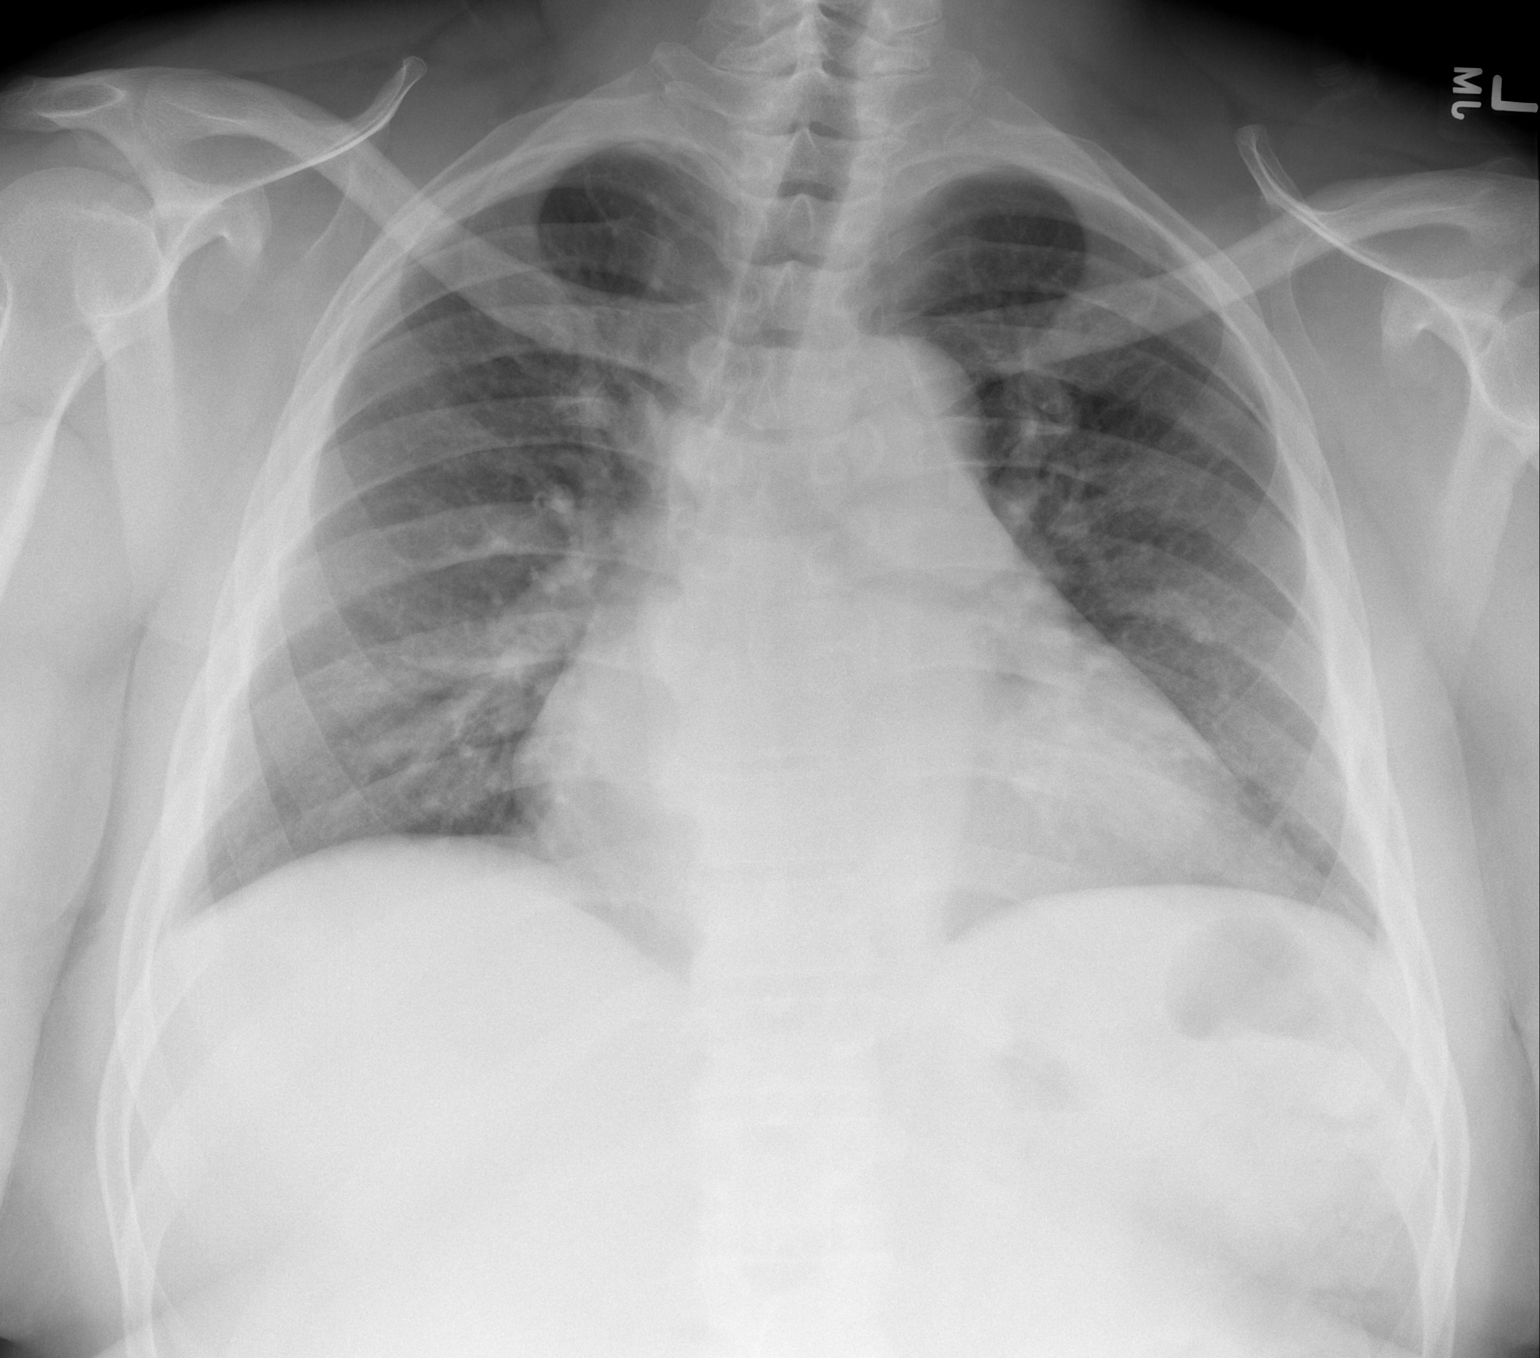

[w chest lat *]
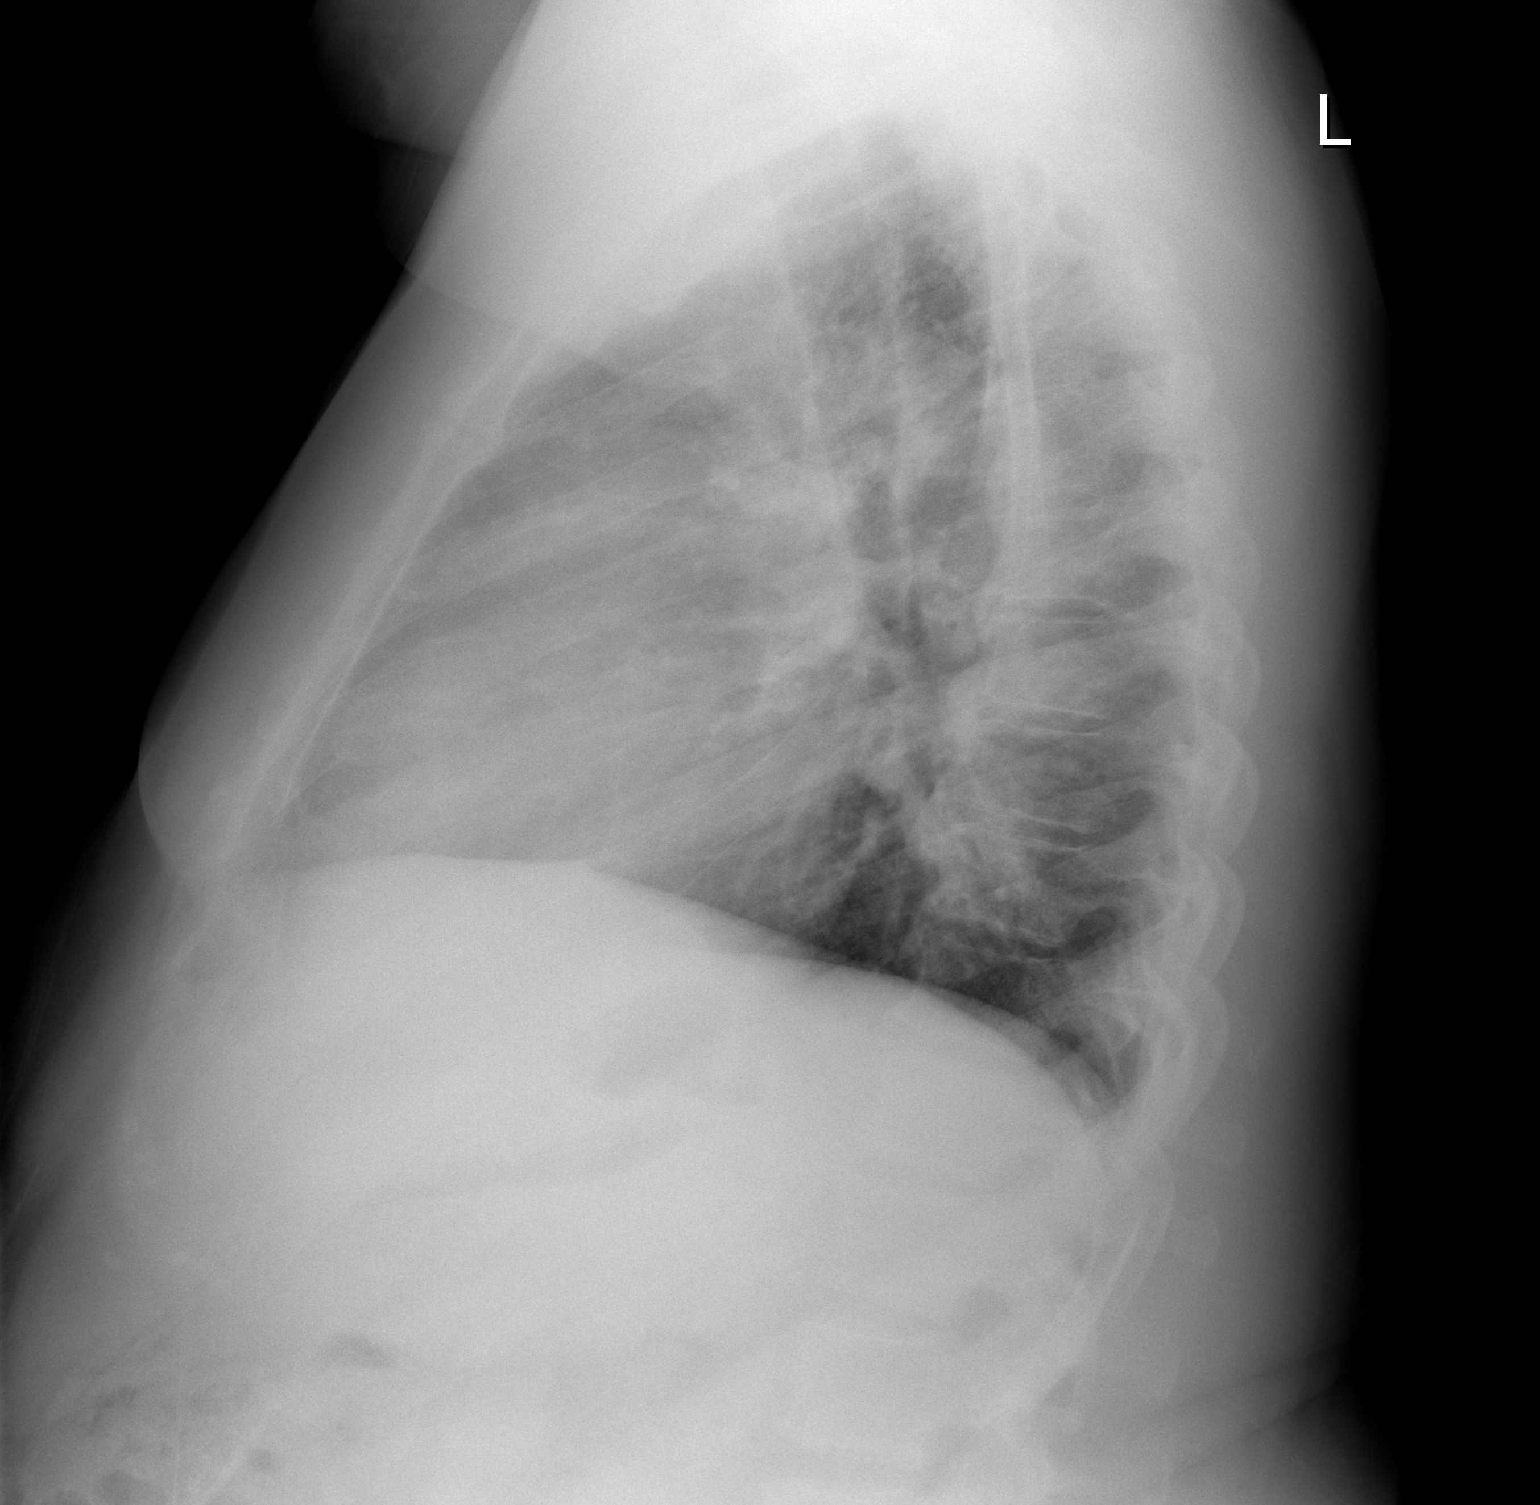

[2 of 2 positions shown; findings below may reference images not displayed]

FINDINGS: Suspected cardiac enlargement. Low lung volumes. BILATERAL perihilar
and lower lobe opacities appear represent early consolidation. This
is worse on the LEFT. There is no effusion or pneumothorax. Bones
are unremarkable. Findings represent an interval change from priors.
IMPRESSION: Suspected early BILATERAL pneumonia, worse on the LEFT.

## 2019-02-24 DIAGNOSIS — D631 Anemia in chronic kidney disease: Secondary | ICD-10-CM | POA: Diagnosis not present

## 2019-02-24 DIAGNOSIS — N185 Chronic kidney disease, stage 5: Secondary | ICD-10-CM | POA: Diagnosis not present

## 2019-03-10 DIAGNOSIS — N185 Chronic kidney disease, stage 5: Secondary | ICD-10-CM | POA: Diagnosis not present

## 2019-03-10 DIAGNOSIS — D631 Anemia in chronic kidney disease: Secondary | ICD-10-CM | POA: Diagnosis not present

## 2019-03-10 DIAGNOSIS — M1A29X0 Drug-induced chronic gout, multiple sites, without tophus (tophi): Secondary | ICD-10-CM | POA: Diagnosis not present

## 2019-03-10 DIAGNOSIS — E1122 Type 2 diabetes mellitus with diabetic chronic kidney disease: Secondary | ICD-10-CM | POA: Diagnosis not present

## 2019-03-10 DIAGNOSIS — E782 Mixed hyperlipidemia: Secondary | ICD-10-CM | POA: Diagnosis not present

## 2019-03-10 DIAGNOSIS — I120 Hypertensive chronic kidney disease with stage 5 chronic kidney disease or end stage renal disease: Secondary | ICD-10-CM | POA: Diagnosis not present

## 2019-03-22 DIAGNOSIS — L0889 Other specified local infections of the skin and subcutaneous tissue: Secondary | ICD-10-CM | POA: Diagnosis not present

## 2019-03-22 DIAGNOSIS — L089 Local infection of the skin and subcutaneous tissue, unspecified: Secondary | ICD-10-CM | POA: Diagnosis not present

## 2019-03-22 DIAGNOSIS — L989 Disorder of the skin and subcutaneous tissue, unspecified: Secondary | ICD-10-CM | POA: Diagnosis not present

## 2019-03-22 DIAGNOSIS — L723 Sebaceous cyst: Secondary | ICD-10-CM | POA: Diagnosis not present

## 2019-03-22 DIAGNOSIS — R52 Pain, unspecified: Secondary | ICD-10-CM | POA: Diagnosis not present

## 2019-03-22 DIAGNOSIS — L02212 Cutaneous abscess of back [any part, except buttock]: Secondary | ICD-10-CM | POA: Diagnosis not present

## 2019-03-28 DIAGNOSIS — N186 End stage renal disease: Secondary | ICD-10-CM | POA: Diagnosis not present

## 2019-03-28 DIAGNOSIS — I5023 Acute on chronic systolic (congestive) heart failure: Secondary | ICD-10-CM | POA: Diagnosis not present

## 2019-03-28 DIAGNOSIS — R0603 Acute respiratory distress: Secondary | ICD-10-CM | POA: Diagnosis not present

## 2019-03-28 DIAGNOSIS — G9341 Metabolic encephalopathy: Secondary | ICD-10-CM | POA: Diagnosis not present

## 2019-03-28 DIAGNOSIS — R0602 Shortness of breath: Secondary | ICD-10-CM | POA: Diagnosis not present

## 2019-03-28 DIAGNOSIS — I440 Atrioventricular block, first degree: Secondary | ICD-10-CM | POA: Diagnosis not present

## 2019-03-28 DIAGNOSIS — I959 Hypotension, unspecified: Secondary | ICD-10-CM | POA: Diagnosis not present

## 2019-03-28 DIAGNOSIS — D631 Anemia in chronic kidney disease: Secondary | ICD-10-CM | POA: Diagnosis not present

## 2019-03-28 DIAGNOSIS — I132 Hypertensive heart and chronic kidney disease with heart failure and with stage 5 chronic kidney disease, or end stage renal disease: Secondary | ICD-10-CM | POA: Diagnosis not present

## 2019-03-28 DIAGNOSIS — L723 Sebaceous cyst: Secondary | ICD-10-CM | POA: Diagnosis not present

## 2019-03-28 DIAGNOSIS — E872 Acidosis: Secondary | ICD-10-CM | POA: Diagnosis not present

## 2019-03-28 DIAGNOSIS — L02212 Cutaneous abscess of back [any part, except buttock]: Secondary | ICD-10-CM | POA: Diagnosis not present

## 2019-03-28 DIAGNOSIS — T460X1A Poisoning by cardiac-stimulant glycosides and drugs of similar action, accidental (unintentional), initial encounter: Secondary | ICD-10-CM | POA: Diagnosis not present

## 2019-03-28 DIAGNOSIS — R571 Hypovolemic shock: Secondary | ICD-10-CM | POA: Diagnosis not present

## 2019-03-28 DIAGNOSIS — L089 Local infection of the skin and subcutaneous tissue, unspecified: Secondary | ICD-10-CM | POA: Diagnosis not present

## 2019-03-28 DIAGNOSIS — I5021 Acute systolic (congestive) heart failure: Secondary | ICD-10-CM | POA: Diagnosis not present

## 2019-03-28 DIAGNOSIS — R0689 Other abnormalities of breathing: Secondary | ICD-10-CM | POA: Diagnosis not present

## 2019-03-28 DIAGNOSIS — I5020 Unspecified systolic (congestive) heart failure: Secondary | ICD-10-CM | POA: Diagnosis not present

## 2019-03-28 DIAGNOSIS — Y999 Unspecified external cause status: Secondary | ICD-10-CM | POA: Diagnosis not present

## 2019-03-28 DIAGNOSIS — I454 Nonspecific intraventricular block: Secondary | ICD-10-CM | POA: Diagnosis not present

## 2019-03-28 DIAGNOSIS — N179 Acute kidney failure, unspecified: Secondary | ICD-10-CM | POA: Diagnosis not present

## 2019-03-28 DIAGNOSIS — R001 Bradycardia, unspecified: Secondary | ICD-10-CM | POA: Diagnosis not present

## 2019-03-28 DIAGNOSIS — E1122 Type 2 diabetes mellitus with diabetic chronic kidney disease: Secondary | ICD-10-CM | POA: Diagnosis not present

## 2019-03-28 DIAGNOSIS — I509 Heart failure, unspecified: Secondary | ICD-10-CM | POA: Diagnosis not present

## 2019-03-28 DIAGNOSIS — B9561 Methicillin susceptible Staphylococcus aureus infection as the cause of diseases classified elsewhere: Secondary | ICD-10-CM | POA: Diagnosis not present

## 2019-03-28 DIAGNOSIS — J9600 Acute respiratory failure, unspecified whether with hypoxia or hypercapnia: Secondary | ICD-10-CM | POA: Diagnosis not present

## 2019-03-28 DIAGNOSIS — I120 Hypertensive chronic kidney disease with stage 5 chronic kidney disease or end stage renal disease: Secondary | ICD-10-CM | POA: Diagnosis not present

## 2019-03-28 DIAGNOSIS — X58XXXA Exposure to other specified factors, initial encounter: Secondary | ICD-10-CM | POA: Diagnosis not present

## 2019-03-28 DIAGNOSIS — E875 Hyperkalemia: Secondary | ICD-10-CM | POA: Diagnosis not present

## 2019-03-28 DIAGNOSIS — Z992 Dependence on renal dialysis: Secondary | ICD-10-CM | POA: Diagnosis not present

## 2019-03-28 DIAGNOSIS — R809 Proteinuria, unspecified: Secondary | ICD-10-CM | POA: Diagnosis not present

## 2019-04-01 DIAGNOSIS — E1122 Type 2 diabetes mellitus with diabetic chronic kidney disease: Secondary | ICD-10-CM | POA: Diagnosis not present

## 2019-04-01 DIAGNOSIS — Z992 Dependence on renal dialysis: Secondary | ICD-10-CM | POA: Diagnosis not present

## 2019-04-01 DIAGNOSIS — D509 Iron deficiency anemia, unspecified: Secondary | ICD-10-CM | POA: Diagnosis not present

## 2019-04-01 DIAGNOSIS — N186 End stage renal disease: Secondary | ICD-10-CM | POA: Diagnosis not present

## 2019-04-01 DIAGNOSIS — N2581 Secondary hyperparathyroidism of renal origin: Secondary | ICD-10-CM | POA: Diagnosis not present

## 2019-04-01 DIAGNOSIS — R69 Illness, unspecified: Secondary | ICD-10-CM | POA: Diagnosis not present

## 2019-04-04 DIAGNOSIS — D631 Anemia in chronic kidney disease: Secondary | ICD-10-CM | POA: Diagnosis not present

## 2019-04-04 DIAGNOSIS — D509 Iron deficiency anemia, unspecified: Secondary | ICD-10-CM | POA: Diagnosis not present

## 2019-04-04 DIAGNOSIS — N186 End stage renal disease: Secondary | ICD-10-CM | POA: Diagnosis not present

## 2019-04-04 DIAGNOSIS — I10 Essential (primary) hypertension: Secondary | ICD-10-CM | POA: Diagnosis not present

## 2019-04-04 DIAGNOSIS — N2581 Secondary hyperparathyroidism of renal origin: Secondary | ICD-10-CM | POA: Diagnosis not present

## 2019-04-05 DIAGNOSIS — Z4800 Encounter for change or removal of nonsurgical wound dressing: Secondary | ICD-10-CM | POA: Diagnosis not present

## 2019-04-05 DIAGNOSIS — Z792 Long term (current) use of antibiotics: Secondary | ICD-10-CM | POA: Diagnosis not present

## 2019-04-05 DIAGNOSIS — L089 Local infection of the skin and subcutaneous tissue, unspecified: Secondary | ICD-10-CM | POA: Diagnosis not present

## 2019-04-05 DIAGNOSIS — L723 Sebaceous cyst: Secondary | ICD-10-CM | POA: Diagnosis not present

## 2019-04-08 DIAGNOSIS — E1122 Type 2 diabetes mellitus with diabetic chronic kidney disease: Secondary | ICD-10-CM | POA: Diagnosis not present

## 2019-04-08 DIAGNOSIS — I10 Essential (primary) hypertension: Secondary | ICD-10-CM | POA: Diagnosis not present

## 2019-04-28 DIAGNOSIS — Z01818 Encounter for other preprocedural examination: Secondary | ICD-10-CM | POA: Diagnosis not present

## 2019-04-28 DIAGNOSIS — N186 End stage renal disease: Secondary | ICD-10-CM | POA: Diagnosis not present

## 2019-05-02 DIAGNOSIS — N186 End stage renal disease: Secondary | ICD-10-CM | POA: Diagnosis not present

## 2019-05-02 DIAGNOSIS — Z992 Dependence on renal dialysis: Secondary | ICD-10-CM | POA: Diagnosis not present

## 2019-05-03 DIAGNOSIS — E1122 Type 2 diabetes mellitus with diabetic chronic kidney disease: Secondary | ICD-10-CM | POA: Diagnosis not present

## 2019-05-03 DIAGNOSIS — N185 Chronic kidney disease, stage 5: Secondary | ICD-10-CM | POA: Diagnosis not present

## 2019-05-03 DIAGNOSIS — I120 Hypertensive chronic kidney disease with stage 5 chronic kidney disease or end stage renal disease: Secondary | ICD-10-CM | POA: Diagnosis not present

## 2019-05-04 DIAGNOSIS — N186 End stage renal disease: Secondary | ICD-10-CM | POA: Diagnosis not present

## 2019-05-04 DIAGNOSIS — N2581 Secondary hyperparathyroidism of renal origin: Secondary | ICD-10-CM | POA: Diagnosis not present

## 2019-05-04 DIAGNOSIS — D631 Anemia in chronic kidney disease: Secondary | ICD-10-CM | POA: Diagnosis not present

## 2019-05-04 DIAGNOSIS — E8779 Other fluid overload: Secondary | ICD-10-CM | POA: Diagnosis not present

## 2019-05-04 DIAGNOSIS — D509 Iron deficiency anemia, unspecified: Secondary | ICD-10-CM | POA: Diagnosis not present

## 2019-05-06 DIAGNOSIS — E1122 Type 2 diabetes mellitus with diabetic chronic kidney disease: Secondary | ICD-10-CM | POA: Diagnosis not present

## 2019-05-30 DIAGNOSIS — N186 End stage renal disease: Secondary | ICD-10-CM | POA: Diagnosis not present

## 2019-05-30 DIAGNOSIS — Z992 Dependence on renal dialysis: Secondary | ICD-10-CM | POA: Diagnosis not present

## 2019-06-01 DIAGNOSIS — N2581 Secondary hyperparathyroidism of renal origin: Secondary | ICD-10-CM | POA: Diagnosis not present

## 2019-06-01 DIAGNOSIS — D509 Iron deficiency anemia, unspecified: Secondary | ICD-10-CM | POA: Diagnosis not present

## 2019-06-01 DIAGNOSIS — N186 End stage renal disease: Secondary | ICD-10-CM | POA: Diagnosis not present

## 2019-06-01 DIAGNOSIS — D631 Anemia in chronic kidney disease: Secondary | ICD-10-CM | POA: Diagnosis not present

## 2019-06-01 DIAGNOSIS — E8779 Other fluid overload: Secondary | ICD-10-CM | POA: Diagnosis not present

## 2019-06-02 DIAGNOSIS — E782 Mixed hyperlipidemia: Secondary | ICD-10-CM | POA: Diagnosis not present

## 2019-06-02 DIAGNOSIS — E0865 Diabetes mellitus due to underlying condition with hyperglycemia: Secondary | ICD-10-CM | POA: Diagnosis not present

## 2019-06-02 DIAGNOSIS — I2583 Coronary atherosclerosis due to lipid rich plaque: Secondary | ICD-10-CM | POA: Diagnosis not present

## 2019-06-02 DIAGNOSIS — I770 Arteriovenous fistula, acquired: Secondary | ICD-10-CM | POA: Diagnosis not present

## 2019-06-02 DIAGNOSIS — E1122 Type 2 diabetes mellitus with diabetic chronic kidney disease: Secondary | ICD-10-CM | POA: Diagnosis not present

## 2019-06-02 DIAGNOSIS — I2510 Atherosclerotic heart disease of native coronary artery without angina pectoris: Secondary | ICD-10-CM | POA: Diagnosis not present

## 2019-06-02 DIAGNOSIS — R69 Illness, unspecified: Secondary | ICD-10-CM | POA: Diagnosis not present

## 2019-06-02 DIAGNOSIS — I120 Hypertensive chronic kidney disease with stage 5 chronic kidney disease or end stage renal disease: Secondary | ICD-10-CM | POA: Diagnosis not present

## 2019-06-02 DIAGNOSIS — N185 Chronic kidney disease, stage 5: Secondary | ICD-10-CM | POA: Diagnosis not present

## 2019-06-02 DIAGNOSIS — E0822 Diabetes mellitus due to underlying condition with diabetic chronic kidney disease: Secondary | ICD-10-CM | POA: Diagnosis not present

## 2019-06-03 DIAGNOSIS — E1122 Type 2 diabetes mellitus with diabetic chronic kidney disease: Secondary | ICD-10-CM | POA: Diagnosis not present

## 2019-06-09 DIAGNOSIS — Z992 Dependence on renal dialysis: Secondary | ICD-10-CM | POA: Diagnosis not present

## 2019-06-09 DIAGNOSIS — Y828 Other medical devices associated with adverse incidents: Secondary | ICD-10-CM | POA: Diagnosis not present

## 2019-06-09 DIAGNOSIS — N186 End stage renal disease: Secondary | ICD-10-CM | POA: Diagnosis not present

## 2019-06-09 DIAGNOSIS — T82858A Stenosis of vascular prosthetic devices, implants and grafts, initial encounter: Secondary | ICD-10-CM | POA: Diagnosis not present

## 2019-06-09 DIAGNOSIS — T82590A Other mechanical complication of surgically created arteriovenous fistula, initial encounter: Secondary | ICD-10-CM | POA: Diagnosis not present

## 2019-06-24 DIAGNOSIS — N186 End stage renal disease: Secondary | ICD-10-CM | POA: Diagnosis not present

## 2019-06-24 DIAGNOSIS — E1122 Type 2 diabetes mellitus with diabetic chronic kidney disease: Secondary | ICD-10-CM | POA: Diagnosis not present

## 2019-06-24 DIAGNOSIS — Z20822 Contact with and (suspected) exposure to covid-19: Secondary | ICD-10-CM | POA: Diagnosis not present

## 2019-06-24 DIAGNOSIS — Z01812 Encounter for preprocedural laboratory examination: Secondary | ICD-10-CM | POA: Diagnosis not present

## 2019-06-24 DIAGNOSIS — I120 Hypertensive chronic kidney disease with stage 5 chronic kidney disease or end stage renal disease: Secondary | ICD-10-CM | POA: Diagnosis not present

## 2019-06-24 DIAGNOSIS — T82858A Stenosis of vascular prosthetic devices, implants and grafts, initial encounter: Secondary | ICD-10-CM | POA: Diagnosis not present

## 2019-06-28 DIAGNOSIS — N186 End stage renal disease: Secondary | ICD-10-CM | POA: Diagnosis not present

## 2019-06-28 DIAGNOSIS — Z992 Dependence on renal dialysis: Secondary | ICD-10-CM | POA: Diagnosis not present

## 2019-06-28 DIAGNOSIS — T82858A Stenosis of vascular prosthetic devices, implants and grafts, initial encounter: Secondary | ICD-10-CM | POA: Diagnosis not present

## 2019-06-28 DIAGNOSIS — I120 Hypertensive chronic kidney disease with stage 5 chronic kidney disease or end stage renal disease: Secondary | ICD-10-CM | POA: Diagnosis not present

## 2019-06-28 DIAGNOSIS — Y838 Other surgical procedures as the cause of abnormal reaction of the patient, or of later complication, without mention of misadventure at the time of the procedure: Secondary | ICD-10-CM | POA: Diagnosis not present

## 2019-06-30 DIAGNOSIS — N186 End stage renal disease: Secondary | ICD-10-CM | POA: Diagnosis not present

## 2019-06-30 DIAGNOSIS — Z992 Dependence on renal dialysis: Secondary | ICD-10-CM | POA: Diagnosis not present

## 2019-07-01 DIAGNOSIS — N186 End stage renal disease: Secondary | ICD-10-CM | POA: Diagnosis not present

## 2019-07-01 DIAGNOSIS — N2581 Secondary hyperparathyroidism of renal origin: Secondary | ICD-10-CM | POA: Diagnosis not present

## 2019-07-01 DIAGNOSIS — D631 Anemia in chronic kidney disease: Secondary | ICD-10-CM | POA: Diagnosis not present

## 2019-07-01 DIAGNOSIS — I10 Essential (primary) hypertension: Secondary | ICD-10-CM | POA: Diagnosis not present

## 2019-07-01 DIAGNOSIS — D509 Iron deficiency anemia, unspecified: Secondary | ICD-10-CM | POA: Diagnosis not present

## 2019-07-07 DIAGNOSIS — E1122 Type 2 diabetes mellitus with diabetic chronic kidney disease: Secondary | ICD-10-CM | POA: Diagnosis not present

## 2019-07-07 DIAGNOSIS — I10 Essential (primary) hypertension: Secondary | ICD-10-CM | POA: Diagnosis not present

## 2019-07-25 DIAGNOSIS — E213 Hyperparathyroidism, unspecified: Secondary | ICD-10-CM | POA: Diagnosis not present

## 2019-07-25 DIAGNOSIS — I429 Cardiomyopathy, unspecified: Secondary | ICD-10-CM | POA: Diagnosis not present

## 2019-07-25 DIAGNOSIS — Z992 Dependence on renal dialysis: Secondary | ICD-10-CM | POA: Diagnosis not present

## 2019-07-25 DIAGNOSIS — I132 Hypertensive heart and chronic kidney disease with heart failure and with stage 5 chronic kidney disease, or end stage renal disease: Secondary | ICD-10-CM | POA: Diagnosis not present

## 2019-07-25 DIAGNOSIS — N186 End stage renal disease: Secondary | ICD-10-CM | POA: Diagnosis not present

## 2019-07-25 DIAGNOSIS — E1122 Type 2 diabetes mellitus with diabetic chronic kidney disease: Secondary | ICD-10-CM | POA: Diagnosis not present

## 2019-07-25 DIAGNOSIS — E785 Hyperlipidemia, unspecified: Secondary | ICD-10-CM | POA: Diagnosis not present

## 2019-07-25 DIAGNOSIS — E261 Secondary hyperaldosteronism: Secondary | ICD-10-CM | POA: Diagnosis not present

## 2019-07-25 DIAGNOSIS — I509 Heart failure, unspecified: Secondary | ICD-10-CM | POA: Diagnosis not present

## 2019-07-30 DIAGNOSIS — N186 End stage renal disease: Secondary | ICD-10-CM | POA: Diagnosis not present

## 2019-07-30 DIAGNOSIS — Z992 Dependence on renal dialysis: Secondary | ICD-10-CM | POA: Diagnosis not present

## 2019-08-02 DIAGNOSIS — D509 Iron deficiency anemia, unspecified: Secondary | ICD-10-CM | POA: Diagnosis not present

## 2019-08-02 DIAGNOSIS — D631 Anemia in chronic kidney disease: Secondary | ICD-10-CM | POA: Diagnosis not present

## 2019-08-02 DIAGNOSIS — N2581 Secondary hyperparathyroidism of renal origin: Secondary | ICD-10-CM | POA: Diagnosis not present

## 2019-08-02 DIAGNOSIS — N186 End stage renal disease: Secondary | ICD-10-CM | POA: Diagnosis not present

## 2019-08-04 DIAGNOSIS — E1122 Type 2 diabetes mellitus with diabetic chronic kidney disease: Secondary | ICD-10-CM | POA: Diagnosis not present

## 2019-08-30 DIAGNOSIS — Z992 Dependence on renal dialysis: Secondary | ICD-10-CM | POA: Diagnosis not present

## 2019-08-30 DIAGNOSIS — N186 End stage renal disease: Secondary | ICD-10-CM | POA: Diagnosis not present

## 2019-09-01 DIAGNOSIS — N2581 Secondary hyperparathyroidism of renal origin: Secondary | ICD-10-CM | POA: Diagnosis not present

## 2019-09-01 DIAGNOSIS — E1122 Type 2 diabetes mellitus with diabetic chronic kidney disease: Secondary | ICD-10-CM | POA: Diagnosis not present

## 2019-09-01 DIAGNOSIS — N186 End stage renal disease: Secondary | ICD-10-CM | POA: Diagnosis not present

## 2019-09-01 DIAGNOSIS — D631 Anemia in chronic kidney disease: Secondary | ICD-10-CM | POA: Diagnosis not present

## 2019-09-01 DIAGNOSIS — D509 Iron deficiency anemia, unspecified: Secondary | ICD-10-CM | POA: Diagnosis not present

## 2019-09-29 DIAGNOSIS — N186 End stage renal disease: Secondary | ICD-10-CM | POA: Diagnosis not present

## 2019-09-29 DIAGNOSIS — Z992 Dependence on renal dialysis: Secondary | ICD-10-CM | POA: Diagnosis not present

## 2019-10-01 DIAGNOSIS — N186 End stage renal disease: Secondary | ICD-10-CM | POA: Diagnosis not present

## 2019-10-01 DIAGNOSIS — N2581 Secondary hyperparathyroidism of renal origin: Secondary | ICD-10-CM | POA: Diagnosis not present

## 2019-10-01 DIAGNOSIS — I10 Essential (primary) hypertension: Secondary | ICD-10-CM | POA: Diagnosis not present

## 2019-10-01 DIAGNOSIS — D631 Anemia in chronic kidney disease: Secondary | ICD-10-CM | POA: Diagnosis not present

## 2019-10-01 DIAGNOSIS — D509 Iron deficiency anemia, unspecified: Secondary | ICD-10-CM | POA: Diagnosis not present

## 2019-10-06 DIAGNOSIS — E1122 Type 2 diabetes mellitus with diabetic chronic kidney disease: Secondary | ICD-10-CM | POA: Diagnosis not present

## 2019-10-06 DIAGNOSIS — I10 Essential (primary) hypertension: Secondary | ICD-10-CM | POA: Diagnosis not present

## 2019-10-30 DIAGNOSIS — Z992 Dependence on renal dialysis: Secondary | ICD-10-CM | POA: Diagnosis not present

## 2019-10-30 DIAGNOSIS — N186 End stage renal disease: Secondary | ICD-10-CM | POA: Diagnosis not present

## 2019-11-01 DIAGNOSIS — D509 Iron deficiency anemia, unspecified: Secondary | ICD-10-CM | POA: Diagnosis not present

## 2019-11-01 DIAGNOSIS — D631 Anemia in chronic kidney disease: Secondary | ICD-10-CM | POA: Diagnosis not present

## 2019-11-01 DIAGNOSIS — N186 End stage renal disease: Secondary | ICD-10-CM | POA: Diagnosis not present

## 2019-11-01 DIAGNOSIS — N2581 Secondary hyperparathyroidism of renal origin: Secondary | ICD-10-CM | POA: Diagnosis not present

## 2019-11-03 DIAGNOSIS — E1122 Type 2 diabetes mellitus with diabetic chronic kidney disease: Secondary | ICD-10-CM | POA: Diagnosis not present

## 2019-11-30 DIAGNOSIS — N186 End stage renal disease: Secondary | ICD-10-CM | POA: Diagnosis not present

## 2019-11-30 DIAGNOSIS — Z992 Dependence on renal dialysis: Secondary | ICD-10-CM | POA: Diagnosis not present

## 2019-12-01 DIAGNOSIS — D631 Anemia in chronic kidney disease: Secondary | ICD-10-CM | POA: Diagnosis not present

## 2019-12-01 DIAGNOSIS — E1122 Type 2 diabetes mellitus with diabetic chronic kidney disease: Secondary | ICD-10-CM | POA: Diagnosis not present

## 2019-12-01 DIAGNOSIS — N186 End stage renal disease: Secondary | ICD-10-CM | POA: Diagnosis not present

## 2019-12-01 DIAGNOSIS — N2581 Secondary hyperparathyroidism of renal origin: Secondary | ICD-10-CM | POA: Diagnosis not present

## 2019-12-23 DIAGNOSIS — I2583 Coronary atherosclerosis due to lipid rich plaque: Secondary | ICD-10-CM | POA: Diagnosis not present

## 2019-12-23 DIAGNOSIS — E782 Mixed hyperlipidemia: Secondary | ICD-10-CM | POA: Diagnosis not present

## 2019-12-23 DIAGNOSIS — D631 Anemia in chronic kidney disease: Secondary | ICD-10-CM | POA: Diagnosis not present

## 2019-12-23 DIAGNOSIS — I770 Arteriovenous fistula, acquired: Secondary | ICD-10-CM | POA: Diagnosis not present

## 2019-12-23 DIAGNOSIS — E0822 Diabetes mellitus due to underlying condition with diabetic chronic kidney disease: Secondary | ICD-10-CM | POA: Diagnosis not present

## 2019-12-23 DIAGNOSIS — E1122 Type 2 diabetes mellitus with diabetic chronic kidney disease: Secondary | ICD-10-CM | POA: Diagnosis not present

## 2019-12-23 DIAGNOSIS — N185 Chronic kidney disease, stage 5: Secondary | ICD-10-CM | POA: Diagnosis not present

## 2019-12-23 DIAGNOSIS — I120 Hypertensive chronic kidney disease with stage 5 chronic kidney disease or end stage renal disease: Secondary | ICD-10-CM | POA: Diagnosis not present

## 2019-12-23 DIAGNOSIS — E0865 Diabetes mellitus due to underlying condition with hyperglycemia: Secondary | ICD-10-CM | POA: Diagnosis not present

## 2019-12-23 DIAGNOSIS — I2510 Atherosclerotic heart disease of native coronary artery without angina pectoris: Secondary | ICD-10-CM | POA: Diagnosis not present

## 2019-12-30 DIAGNOSIS — Z992 Dependence on renal dialysis: Secondary | ICD-10-CM | POA: Diagnosis not present

## 2019-12-30 DIAGNOSIS — N186 End stage renal disease: Secondary | ICD-10-CM | POA: Diagnosis not present

## 2019-12-31 DIAGNOSIS — N186 End stage renal disease: Secondary | ICD-10-CM | POA: Diagnosis not present

## 2019-12-31 DIAGNOSIS — D509 Iron deficiency anemia, unspecified: Secondary | ICD-10-CM | POA: Diagnosis not present

## 2019-12-31 DIAGNOSIS — N2581 Secondary hyperparathyroidism of renal origin: Secondary | ICD-10-CM | POA: Diagnosis not present

## 2019-12-31 DIAGNOSIS — D631 Anemia in chronic kidney disease: Secondary | ICD-10-CM | POA: Diagnosis not present

## 2019-12-31 DIAGNOSIS — I10 Essential (primary) hypertension: Secondary | ICD-10-CM | POA: Diagnosis not present

## 2020-01-05 DIAGNOSIS — I10 Essential (primary) hypertension: Secondary | ICD-10-CM | POA: Diagnosis not present

## 2020-01-05 DIAGNOSIS — E1122 Type 2 diabetes mellitus with diabetic chronic kidney disease: Secondary | ICD-10-CM | POA: Diagnosis not present

## 2020-01-06 DIAGNOSIS — N185 Chronic kidney disease, stage 5: Secondary | ICD-10-CM | POA: Diagnosis not present

## 2020-01-06 DIAGNOSIS — N186 End stage renal disease: Secondary | ICD-10-CM | POA: Diagnosis not present

## 2020-01-06 DIAGNOSIS — R195 Other fecal abnormalities: Secondary | ICD-10-CM | POA: Diagnosis not present

## 2020-01-06 DIAGNOSIS — D649 Anemia, unspecified: Secondary | ICD-10-CM | POA: Diagnosis not present

## 2020-01-06 DIAGNOSIS — I120 Hypertensive chronic kidney disease with stage 5 chronic kidney disease or end stage renal disease: Secondary | ICD-10-CM | POA: Diagnosis not present

## 2020-01-06 DIAGNOSIS — Z79899 Other long term (current) drug therapy: Secondary | ICD-10-CM | POA: Diagnosis not present

## 2020-01-06 DIAGNOSIS — K922 Gastrointestinal hemorrhage, unspecified: Secondary | ICD-10-CM | POA: Diagnosis not present

## 2020-01-06 DIAGNOSIS — Z992 Dependence on renal dialysis: Secondary | ICD-10-CM | POA: Diagnosis not present

## 2020-01-06 DIAGNOSIS — D631 Anemia in chronic kidney disease: Secondary | ICD-10-CM | POA: Diagnosis not present

## 2020-01-06 DIAGNOSIS — E1122 Type 2 diabetes mellitus with diabetic chronic kidney disease: Secondary | ICD-10-CM | POA: Diagnosis not present

## 2020-01-06 DIAGNOSIS — K625 Hemorrhage of anus and rectum: Secondary | ICD-10-CM | POA: Diagnosis not present

## 2020-01-06 DIAGNOSIS — Z20822 Contact with and (suspected) exposure to covid-19: Secondary | ICD-10-CM | POA: Diagnosis not present

## 2020-01-06 DIAGNOSIS — K648 Other hemorrhoids: Secondary | ICD-10-CM | POA: Diagnosis not present

## 2020-01-06 DIAGNOSIS — N179 Acute kidney failure, unspecified: Secondary | ICD-10-CM | POA: Diagnosis not present

## 2020-01-06 DIAGNOSIS — D120 Benign neoplasm of cecum: Secondary | ICD-10-CM | POA: Diagnosis not present

## 2020-01-07 DIAGNOSIS — K648 Other hemorrhoids: Secondary | ICD-10-CM | POA: Diagnosis not present

## 2020-01-07 DIAGNOSIS — D649 Anemia, unspecified: Secondary | ICD-10-CM | POA: Diagnosis not present

## 2020-01-07 DIAGNOSIS — I120 Hypertensive chronic kidney disease with stage 5 chronic kidney disease or end stage renal disease: Secondary | ICD-10-CM | POA: Diagnosis not present

## 2020-01-07 DIAGNOSIS — K5140 Inflammatory polyps of colon without complications: Secondary | ICD-10-CM | POA: Diagnosis not present

## 2020-01-07 DIAGNOSIS — K921 Melena: Secondary | ICD-10-CM | POA: Diagnosis not present

## 2020-01-07 DIAGNOSIS — E1122 Type 2 diabetes mellitus with diabetic chronic kidney disease: Secondary | ICD-10-CM | POA: Diagnosis not present

## 2020-01-07 DIAGNOSIS — N186 End stage renal disease: Secondary | ICD-10-CM | POA: Diagnosis not present

## 2020-01-07 DIAGNOSIS — Z992 Dependence on renal dialysis: Secondary | ICD-10-CM | POA: Diagnosis not present

## 2020-01-07 DIAGNOSIS — N185 Chronic kidney disease, stage 5: Secondary | ICD-10-CM | POA: Diagnosis not present

## 2020-01-07 DIAGNOSIS — E611 Iron deficiency: Secondary | ICD-10-CM | POA: Diagnosis not present

## 2020-01-07 DIAGNOSIS — K635 Polyp of colon: Secondary | ICD-10-CM | POA: Diagnosis not present

## 2020-01-20 DIAGNOSIS — N186 End stage renal disease: Secondary | ICD-10-CM | POA: Diagnosis not present

## 2020-01-20 DIAGNOSIS — Z992 Dependence on renal dialysis: Secondary | ICD-10-CM | POA: Diagnosis not present

## 2020-01-20 DIAGNOSIS — E611 Iron deficiency: Secondary | ICD-10-CM | POA: Diagnosis not present

## 2020-01-20 DIAGNOSIS — K648 Other hemorrhoids: Secondary | ICD-10-CM | POA: Diagnosis not present

## 2020-01-20 DIAGNOSIS — Z6841 Body Mass Index (BMI) 40.0 and over, adult: Secondary | ICD-10-CM | POA: Diagnosis not present

## 2020-01-20 DIAGNOSIS — K625 Hemorrhage of anus and rectum: Secondary | ICD-10-CM | POA: Diagnosis not present

## 2020-01-30 DIAGNOSIS — N186 End stage renal disease: Secondary | ICD-10-CM | POA: Diagnosis not present

## 2020-01-30 DIAGNOSIS — Z992 Dependence on renal dialysis: Secondary | ICD-10-CM | POA: Diagnosis not present

## 2020-01-31 DIAGNOSIS — N186 End stage renal disease: Secondary | ICD-10-CM | POA: Diagnosis not present

## 2020-01-31 DIAGNOSIS — N2581 Secondary hyperparathyroidism of renal origin: Secondary | ICD-10-CM | POA: Diagnosis not present

## 2020-01-31 DIAGNOSIS — D509 Iron deficiency anemia, unspecified: Secondary | ICD-10-CM | POA: Diagnosis not present

## 2020-01-31 DIAGNOSIS — E8779 Other fluid overload: Secondary | ICD-10-CM | POA: Diagnosis not present

## 2020-01-31 DIAGNOSIS — D631 Anemia in chronic kidney disease: Secondary | ICD-10-CM | POA: Diagnosis not present

## 2020-02-02 DIAGNOSIS — E1122 Type 2 diabetes mellitus with diabetic chronic kidney disease: Secondary | ICD-10-CM | POA: Diagnosis not present

## 2020-02-29 DIAGNOSIS — N186 End stage renal disease: Secondary | ICD-10-CM | POA: Diagnosis not present

## 2020-02-29 DIAGNOSIS — Z992 Dependence on renal dialysis: Secondary | ICD-10-CM | POA: Diagnosis not present

## 2020-03-01 DIAGNOSIS — D631 Anemia in chronic kidney disease: Secondary | ICD-10-CM | POA: Diagnosis not present

## 2020-03-01 DIAGNOSIS — N2581 Secondary hyperparathyroidism of renal origin: Secondary | ICD-10-CM | POA: Diagnosis not present

## 2020-03-01 DIAGNOSIS — E1122 Type 2 diabetes mellitus with diabetic chronic kidney disease: Secondary | ICD-10-CM | POA: Diagnosis not present

## 2020-03-01 DIAGNOSIS — D509 Iron deficiency anemia, unspecified: Secondary | ICD-10-CM | POA: Diagnosis not present

## 2020-03-01 DIAGNOSIS — N186 End stage renal disease: Secondary | ICD-10-CM | POA: Diagnosis not present

## 2020-03-31 DIAGNOSIS — N186 End stage renal disease: Secondary | ICD-10-CM | POA: Diagnosis not present

## 2020-03-31 DIAGNOSIS — Z992 Dependence on renal dialysis: Secondary | ICD-10-CM | POA: Diagnosis not present

## 2021-01-28 ENCOUNTER — Other Ambulatory Visit: Payer: Self-pay

## 2021-01-28 ENCOUNTER — Encounter (HOSPITAL_BASED_OUTPATIENT_CLINIC_OR_DEPARTMENT_OTHER): Payer: Self-pay | Admitting: Emergency Medicine

## 2021-01-28 ENCOUNTER — Emergency Department (HOSPITAL_BASED_OUTPATIENT_CLINIC_OR_DEPARTMENT_OTHER)
Admission: EM | Admit: 2021-01-28 | Discharge: 2021-01-28 | Disposition: A | Discharge status: Home / Self Care | Payer: Medicare Other | Attending: Emergency Medicine | Admitting: Emergency Medicine

## 2021-01-28 DIAGNOSIS — I2510 Atherosclerotic heart disease of native coronary artery without angina pectoris: Secondary | ICD-10-CM | POA: Insufficient documentation

## 2021-01-28 DIAGNOSIS — N183 Chronic kidney disease, stage 3 unspecified: Secondary | ICD-10-CM | POA: Insufficient documentation

## 2021-01-28 DIAGNOSIS — Z79899 Other long term (current) drug therapy: Secondary | ICD-10-CM | POA: Insufficient documentation

## 2021-01-28 DIAGNOSIS — I5022 Chronic systolic (congestive) heart failure: Secondary | ICD-10-CM | POA: Insufficient documentation

## 2021-01-28 DIAGNOSIS — E1122 Type 2 diabetes mellitus with diabetic chronic kidney disease: Secondary | ICD-10-CM | POA: Diagnosis not present

## 2021-01-28 DIAGNOSIS — I130 Hypertensive heart and chronic kidney disease with heart failure and stage 1 through stage 4 chronic kidney disease, or unspecified chronic kidney disease: Secondary | ICD-10-CM | POA: Diagnosis not present

## 2021-01-28 DIAGNOSIS — J069 Acute upper respiratory infection, unspecified: Secondary | ICD-10-CM | POA: Diagnosis not present

## 2021-01-28 DIAGNOSIS — R059 Cough, unspecified: Secondary | ICD-10-CM | POA: Diagnosis present

## 2021-01-28 DIAGNOSIS — Z7984 Long term (current) use of oral hypoglycemic drugs: Secondary | ICD-10-CM | POA: Insufficient documentation

## 2021-01-28 DIAGNOSIS — Z7952 Long term (current) use of systemic steroids: Secondary | ICD-10-CM | POA: Insufficient documentation

## 2021-01-28 MED ORDER — BENZONATATE 100 MG PO CAPS: 100.0000 mg | ORAL_CAPSULE | Freq: Three times a day (TID) | ORAL | 0 refills | Status: AC | PRN

## 2021-01-28 MED ORDER — BENZONATATE 100 MG PO CAPS: 100.0000 mg | ORAL_CAPSULE | Freq: Three times a day (TID) | ORAL | 0 refills | Status: DC | PRN

## 2021-01-28 MED ORDER — GUAIFENESIN ER 600 MG PO TB12: 1200.0000 mg | ORAL_TABLET | Freq: Two times a day (BID) | ORAL | 0 refills | Status: DC

## 2021-01-28 MED ORDER — GUAIFENESIN ER 600 MG PO TB12: 1200.0000 mg | ORAL_TABLET | Freq: Two times a day (BID) | ORAL | 0 refills | Status: AC

## 2021-01-28 NOTE — ED Provider Notes (Signed)
[facility] EMERGENCY DEPARTMENT Provider Note   CSN: [id number] Arrival date & time: 01/28/21  7342     History No chief complaint on file.   Joshua Fernandez is a 60 y.o. male.  Patient presents to the emergency department for evaluation of nasal congestion and cough.  Symptoms present for 2 days.  Patient's wife has similar symptoms and has been sick for approximately a week.  No shortness of breath.  No fever.      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   Vitamin D deficiency     Patient Active Problem List   Diagnosis Date Noted   Chronic systolic congestive heart failure ([place]) 04/26/2017   Left knee pain 04/26/2017   Cardiomyopathy ([place]) 02/04/2017   Rheumatoid arthritis ([place]) 02/04/2017   Diabetes mellitus without complication ([place]) 87/68/1157   Coronary artery disease 02/04/2017   Acute respiratory failure with hypoxia ([place]) 07/12/2016   Hypokalemia 07/12/2016   Hypomagnesemia 07/12/2016   CKD (chronic kidney disease), stage III ([place]) 07/12/2016   Sepsis ([place]) 07/10/2016   Long term current use of anticoagulant therapy 09/08/2015   Essential hypertension 06/27/2015   Chronic gout of multiple sites 06/27/2015   Hyperlipemia 03/31/2015    Past Surgical History:  Procedure Laterality Date   FRACTURE SURGERY         No family history on file.  Social History   Tobacco Use   Smoking status: Never   Smokeless tobacco: Never  Vaping Use   Vaping Use: Never used  Substance Use Topics   Alcohol use: No   Drug  use: No    Home Medications Prior to Admission medications   Medication Sig Start Date End Date Taking? Authorizing Provider  albuterol (PROVENTIL HFA;VENTOLIN HFA) 108 (90 Base) MCG/ACT inhaler Inhale 2 puffs every 4 (four) hours into the lungs. 07/18/16   [provider]  amLODipine (NORVASC) 10 MG tablet Take 10 mg daily by mouth. 11/01/16   [provider]  atorvastatin (LIPITOR) 40 MG tablet Take 40 mg by mouth every morning.     [provider]  azithromycin (ZITHROMAX) 250 MG tablet Take 1 tablet (250 mg total) daily by mouth. 02/04/17   Patrecia Pour, MD  budesonide-formoterol (SYMBICORT) 160-4.5 MCG/ACT inhaler Inhale 2 puffs 2 (two) times daily into the lungs. 07/18/16   [provider]  carvedilol (COREG) 25 MG tablet Take 25 mg by mouth 2 (two) times daily with a meal.    [provider]  cholecalciferol (VITAMIN D) 400 units TABS tablet Take 800 Units every morning by mouth.     [provider]  colchicine 0.6 MG tablet Take one tablet an hour after getting your first dose here 12/01/17   Deno Etienne, DO  digoxin (LANOXIN) 0.25 MG tablet Take 0.5 tablets (0.125 mg total) every morning by mouth. 02/04/17   Patrecia Pour, MD  famotidine (PEPCID) 10 MG tablet Take 10 mg by mouth  every morning.    [provider]  glipiZIDE (GLUCOTROL XL) 10 MG 24 hr tablet Take 10 mg by mouth daily with breakfast.    [provider]  guaiFENesin (MUCINEX) 600 MG 12 hr tablet Take 600 mg by mouth every morning.    [provider]  HYDROcodone-acetaminophen (NORCO) 5-325 MG tablet Take 1-2 tablets by mouth every 6 (six) hours as needed. 09/27/18   Veryl Speak, MD  latanoprost (XALATAN) 0.005 % ophthalmic solution Place 1 drop into both eyes at bedtime as needed (irritation).     [provider]  lisinopril (PRINIVIL,ZESTRIL) 40 MG tablet Take 40 mg daily by mouth. 12/20/16   [provider]  oxyCODONE (ROXICODONE) 5  MG immediate release tablet Take 1 tablet (5 mg total) by mouth every 4 (four) hours as needed for severe pain. 12/01/17   Deno Etienne, DO  potassium chloride SA (K-DUR,KLOR-CON) 20 MEQ tablet Take 20 mEq by mouth every morning.     [provider]  predniSONE (DELTASONE) 10 MG tablet Take 2 tablets (20 mg total) by mouth 2 (two) times daily. 09/27/18   Veryl Speak, MD    Allergies    Patient has no known allergies.  Review of Systems   Review of Systems  HENT:  Positive for congestion.   Respiratory:  Positive for cough.   All other systems reviewed and are negative.  Physical Exam Updated Vital Signs There were no vitals taken for this visit.  Physical Exam Vitals and nursing note reviewed.  Constitutional:      General: He is not in acute distress.    Appearance: Normal appearance. He is well-developed.  HENT:     Head: Normocephalic and atraumatic.     Right Ear: Hearing normal.     Left Ear: Hearing normal.     Nose: Congestion present.  Eyes:     Conjunctiva/sclera: Conjunctivae normal.     Pupils: Pupils are equal, round, and reactive to light.  Cardiovascular:     Rate and Rhythm: Regular rhythm.     Heart sounds: S1 normal and S2 normal. No murmur heard.   No friction rub. No gallop.  Pulmonary:     Effort: Pulmonary effort is normal. No respiratory distress.     Breath sounds: Normal breath sounds.  Chest:     Chest wall: No tenderness.  Abdominal:     General: Bowel sounds are normal.     Palpations: Abdomen is soft.     Tenderness: There is no abdominal tenderness. There is no guarding or rebound. Negative signs include Murphy's sign and McBurney's sign.     Hernia: No hernia is present.  Musculoskeletal:        General: Normal range of motion.     Cervical back: Normal range of motion and neck supple.  Skin:    General: Skin is warm and dry.     Findings: No rash.  Neurological:     Mental Status: He is alert and oriented to person, place, and  time.     GCS: GCS eye subscore is 4. GCS verbal subscore is 5. GCS motor subscore is 6.     Cranial Nerves: No cranial nerve deficit.     Sensory: No sensory deficit.     Coordination: Coordination normal.  Psychiatric:        Speech: Speech normal.        Behavior: Behavior normal.        Thought Content: Thought content normal.  ED Results / Procedures / Treatments   Labs (all labs ordered are listed, but only abnormal results are displayed) Labs Reviewed - No data to display  EKG None  Radiology No results found.  Procedures Procedures   Medications Ordered in ED Medications - No data to display  ED Course  I have reviewed the triage vital signs and the nursing notes.  Pertinent labs & imaging results that were available during my care of the patient were reviewed by me and considered in my medical decision making (see chart for details).    MDM Rules/Calculators/A&P                           Patient appears well.  He does have significant nasal congestion but lungs are clear.  No wheezing.  No clinical concern for pneumonia.  He does have a sick contact.  Presentation consistent with viral illness.  Final Clinical Impression(s) / ED Diagnoses Final diagnoses:  Upper respiratory tract infection, unspecified type    Rx / DC Orders ED Discharge Orders     None        Orpah Greek, MD 01/28/21 [phone]

## 2021-01-28 NOTE — ED Triage Notes (Signed)
Pt in with congestion and runny nose
